# Patient Record
Sex: Female | Born: 1970 | Race: White | Hispanic: No | Smoking: Former smoker
Health system: Southern US, Community
[De-identification: ages and names within clinical notes are randomized; demographics above are authoritative.]

## PROBLEM LIST (undated history)

## (undated) DIAGNOSIS — F419 Anxiety disorder, unspecified: Secondary | ICD-10-CM

## (undated) DIAGNOSIS — J45909 Unspecified asthma, uncomplicated: Secondary | ICD-10-CM

## (undated) DIAGNOSIS — L709 Acne, unspecified: Secondary | ICD-10-CM

## (undated) DIAGNOSIS — M797 Fibromyalgia: Secondary | ICD-10-CM

## (undated) DIAGNOSIS — K219 Gastro-esophageal reflux disease without esophagitis: Secondary | ICD-10-CM

## (undated) DIAGNOSIS — T7840XA Allergy, unspecified, initial encounter: Secondary | ICD-10-CM

## (undated) DIAGNOSIS — H6990 Unspecified Eustachian tube disorder, unspecified ear: Secondary | ICD-10-CM

## (undated) DIAGNOSIS — E119 Type 2 diabetes mellitus without complications: Secondary | ICD-10-CM

## (undated) HISTORY — PX: MYRINGOTOMY WITH TUBE PLACEMENT: SHX5663

## (undated) HISTORY — PX: TUBAL LIGATION: SHX77

---

## 1998-02-18 ENCOUNTER — Ambulatory Visit (HOSPITAL_COMMUNITY): Admission: RE | Admit: 1998-02-18 | Discharge: 1998-02-18 | Payer: Self-pay | Admitting: *Deleted

## 1998-04-30 ENCOUNTER — Ambulatory Visit (HOSPITAL_COMMUNITY): Admission: RE | Admit: 1998-04-30 | Discharge: 1998-04-30 | Payer: Self-pay | Admitting: *Deleted

## 1998-05-08 ENCOUNTER — Emergency Department (HOSPITAL_COMMUNITY): Admission: EM | Admit: 1998-05-08 | Discharge: 1998-05-08 | Payer: Self-pay | Admitting: *Deleted

## 1998-07-30 ENCOUNTER — Encounter: Admission: RE | Admit: 1998-07-30 | Discharge: 1998-10-28 | Payer: Self-pay | Admitting: *Deleted

## 1998-09-06 ENCOUNTER — Inpatient Hospital Stay (HOSPITAL_COMMUNITY): Admission: AD | Admit: 1998-09-06 | Discharge: 1998-09-06 | Payer: Self-pay | Admitting: Obstetrics

## 1998-09-26 ENCOUNTER — Inpatient Hospital Stay (HOSPITAL_COMMUNITY): Admission: AD | Admit: 1998-09-26 | Discharge: 1998-09-26 | Payer: Self-pay | Admitting: *Deleted

## 1998-09-28 ENCOUNTER — Inpatient Hospital Stay (HOSPITAL_COMMUNITY): Admission: AD | Admit: 1998-09-28 | Discharge: 1998-10-01 | Payer: Self-pay | Admitting: Obstetrics

## 1998-10-09 ENCOUNTER — Inpatient Hospital Stay (HOSPITAL_COMMUNITY): Admission: AD | Admit: 1998-10-09 | Discharge: 1998-10-12 | Payer: Self-pay | Admitting: *Deleted

## 1998-10-13 ENCOUNTER — Inpatient Hospital Stay (HOSPITAL_COMMUNITY): Admission: AD | Admit: 1998-10-13 | Discharge: 1998-10-15 | Payer: Self-pay | Admitting: *Deleted

## 1999-07-09 ENCOUNTER — Emergency Department (HOSPITAL_COMMUNITY): Admission: EM | Admit: 1999-07-09 | Discharge: 1999-07-09 | Payer: Self-pay | Admitting: Internal Medicine

## 1999-07-22 ENCOUNTER — Encounter: Payer: Self-pay | Admitting: Emergency Medicine

## 1999-07-22 ENCOUNTER — Emergency Department (HOSPITAL_COMMUNITY): Admission: EM | Admit: 1999-07-22 | Discharge: 1999-07-22 | Payer: Self-pay | Admitting: Emergency Medicine

## 2000-04-07 ENCOUNTER — Emergency Department (HOSPITAL_COMMUNITY): Admission: EM | Admit: 2000-04-07 | Discharge: 2000-04-07 | Payer: Self-pay | Admitting: Emergency Medicine

## 2000-04-07 ENCOUNTER — Encounter: Payer: Self-pay | Admitting: Emergency Medicine

## 2000-05-22 ENCOUNTER — Emergency Department (HOSPITAL_COMMUNITY): Admission: EM | Admit: 2000-05-22 | Discharge: 2000-05-22 | Payer: Self-pay | Admitting: Emergency Medicine

## 2001-08-23 ENCOUNTER — Emergency Department (HOSPITAL_COMMUNITY): Admission: EM | Admit: 2001-08-23 | Discharge: 2001-08-23 | Payer: Self-pay | Admitting: Emergency Medicine

## 2001-12-30 ENCOUNTER — Emergency Department (HOSPITAL_COMMUNITY): Admission: EM | Admit: 2001-12-30 | Discharge: 2001-12-31 | Payer: Self-pay | Admitting: Emergency Medicine

## 2002-01-01 ENCOUNTER — Emergency Department (HOSPITAL_COMMUNITY): Admission: RE | Admit: 2002-01-01 | Discharge: 2002-01-01 | Payer: Self-pay | Admitting: *Deleted

## 2002-01-01 ENCOUNTER — Encounter: Payer: Self-pay | Admitting: Emergency Medicine

## 2002-01-05 ENCOUNTER — Emergency Department (HOSPITAL_COMMUNITY): Admission: EM | Admit: 2002-01-05 | Discharge: 2002-01-05 | Payer: Self-pay | Admitting: Emergency Medicine

## 2002-01-27 ENCOUNTER — Emergency Department (HOSPITAL_COMMUNITY): Admission: EM | Admit: 2002-01-27 | Discharge: 2002-01-27 | Payer: Self-pay | Admitting: Emergency Medicine

## 2002-01-27 ENCOUNTER — Encounter: Payer: Self-pay | Admitting: Emergency Medicine

## 2003-08-06 ENCOUNTER — Other Ambulatory Visit: Admission: RE | Admit: 2003-08-06 | Discharge: 2003-08-06 | Payer: Self-pay | Admitting: Family Medicine

## 2004-04-06 ENCOUNTER — Emergency Department (HOSPITAL_COMMUNITY): Admission: EM | Admit: 2004-04-06 | Discharge: 2004-04-06 | Payer: Self-pay | Admitting: Emergency Medicine

## 2004-08-06 ENCOUNTER — Emergency Department (HOSPITAL_COMMUNITY): Admission: EM | Admit: 2004-08-06 | Discharge: 2004-08-06 | Payer: Self-pay | Admitting: Emergency Medicine

## 2004-08-17 ENCOUNTER — Emergency Department (HOSPITAL_COMMUNITY): Admission: EM | Admit: 2004-08-17 | Discharge: 2004-08-17 | Payer: Self-pay | Admitting: Emergency Medicine

## 2004-08-22 ENCOUNTER — Emergency Department (HOSPITAL_COMMUNITY): Admission: EM | Admit: 2004-08-22 | Discharge: 2004-08-22 | Payer: Self-pay | Admitting: Emergency Medicine

## 2004-12-20 ENCOUNTER — Emergency Department (HOSPITAL_COMMUNITY): Admission: AD | Admit: 2004-12-20 | Discharge: 2004-12-20 | Payer: Self-pay | Admitting: Family Medicine

## 2005-01-27 ENCOUNTER — Emergency Department (HOSPITAL_COMMUNITY): Admission: EM | Admit: 2005-01-27 | Discharge: 2005-01-27 | Payer: Self-pay | Admitting: Family Medicine

## 2005-01-29 ENCOUNTER — Emergency Department (HOSPITAL_COMMUNITY): Admission: EM | Admit: 2005-01-29 | Discharge: 2005-01-29 | Payer: Self-pay | Admitting: Emergency Medicine

## 2005-01-30 ENCOUNTER — Emergency Department (HOSPITAL_COMMUNITY): Admission: EM | Admit: 2005-01-30 | Discharge: 2005-01-30 | Payer: Self-pay | Admitting: Emergency Medicine

## 2005-02-02 ENCOUNTER — Emergency Department (HOSPITAL_COMMUNITY): Admission: EM | Admit: 2005-02-02 | Discharge: 2005-02-02 | Payer: Self-pay | Admitting: Emergency Medicine

## 2005-05-26 ENCOUNTER — Emergency Department (HOSPITAL_COMMUNITY): Admission: EM | Admit: 2005-05-26 | Discharge: 2005-05-26 | Payer: Self-pay | Admitting: Emergency Medicine

## 2005-09-24 ENCOUNTER — Emergency Department (HOSPITAL_COMMUNITY): Admission: AD | Admit: 2005-09-24 | Discharge: 2005-09-24 | Payer: Self-pay | Admitting: Family Medicine

## 2005-09-25 ENCOUNTER — Emergency Department (HOSPITAL_COMMUNITY): Admission: EM | Admit: 2005-09-25 | Discharge: 2005-09-25 | Payer: Self-pay | Admitting: Family Medicine

## 2005-09-26 ENCOUNTER — Emergency Department (HOSPITAL_COMMUNITY): Admission: EM | Admit: 2005-09-26 | Discharge: 2005-09-26 | Payer: Self-pay | Admitting: Emergency Medicine

## 2005-11-03 ENCOUNTER — Emergency Department (HOSPITAL_COMMUNITY): Admission: EM | Admit: 2005-11-03 | Discharge: 2005-11-03 | Payer: Self-pay | Admitting: Family Medicine

## 2006-03-03 ENCOUNTER — Encounter: Admission: RE | Admit: 2006-03-03 | Discharge: 2006-03-03 | Payer: Self-pay | Admitting: Family Medicine

## 2006-04-01 ENCOUNTER — Emergency Department (HOSPITAL_COMMUNITY): Admission: EM | Admit: 2006-04-01 | Discharge: 2006-04-01 | Payer: Self-pay | Admitting: Family Medicine

## 2006-06-21 ENCOUNTER — Emergency Department (HOSPITAL_COMMUNITY): Admission: EM | Admit: 2006-06-21 | Discharge: 2006-06-21 | Payer: Self-pay | Admitting: Emergency Medicine

## 2006-09-17 ENCOUNTER — Emergency Department (HOSPITAL_COMMUNITY): Admission: EM | Admit: 2006-09-17 | Discharge: 2006-09-17 | Payer: Self-pay | Admitting: Family Medicine

## 2006-09-27 ENCOUNTER — Emergency Department (HOSPITAL_COMMUNITY): Admission: EM | Admit: 2006-09-27 | Discharge: 2006-09-27 | Payer: Self-pay | Admitting: Family Medicine

## 2006-11-07 ENCOUNTER — Other Ambulatory Visit: Admission: RE | Admit: 2006-11-07 | Discharge: 2006-11-07 | Payer: Self-pay | Admitting: Family Medicine

## 2006-11-26 ENCOUNTER — Emergency Department (HOSPITAL_COMMUNITY): Admission: EM | Admit: 2006-11-26 | Discharge: 2006-11-26 | Payer: Self-pay | Admitting: Family Medicine

## 2007-08-28 ENCOUNTER — Emergency Department (HOSPITAL_COMMUNITY): Admission: EM | Admit: 2007-08-28 | Discharge: 2007-08-28 | Payer: Self-pay | Admitting: Family Medicine

## 2008-01-21 ENCOUNTER — Emergency Department (HOSPITAL_COMMUNITY): Admission: EM | Admit: 2008-01-21 | Discharge: 2008-01-21 | Payer: Self-pay | Admitting: Family Medicine

## 2008-08-25 ENCOUNTER — Emergency Department (HOSPITAL_COMMUNITY): Admission: EM | Admit: 2008-08-25 | Discharge: 2008-08-25 | Payer: Self-pay | Admitting: Emergency Medicine

## 2008-11-28 ENCOUNTER — Encounter: Admission: RE | Admit: 2008-11-28 | Discharge: 2008-11-28 | Payer: Self-pay | Admitting: Family Medicine

## 2010-01-29 ENCOUNTER — Other Ambulatory Visit: Admission: RE | Admit: 2010-01-29 | Discharge: 2010-01-29 | Payer: Self-pay | Admitting: Family Medicine

## 2011-02-02 ENCOUNTER — Other Ambulatory Visit: Payer: Self-pay | Admitting: Family Medicine

## 2011-02-02 ENCOUNTER — Other Ambulatory Visit (HOSPITAL_COMMUNITY)
Admission: RE | Admit: 2011-02-02 | Discharge: 2011-02-02 | Disposition: A | Discharge status: Home / Self Care | Payer: Medicaid Other | Source: Ambulatory Visit | Attending: Family Medicine | Admitting: Family Medicine

## 2011-02-02 DIAGNOSIS — Z124 Encounter for screening for malignant neoplasm of cervix: Secondary | ICD-10-CM | POA: Insufficient documentation

## 2011-04-15 LAB — POCT PREGNANCY, URINE: Preg Test, Ur: NEGATIVE

## 2011-08-26 ENCOUNTER — Inpatient Hospital Stay (EMERGENCY_DEPARTMENT_HOSPITAL)
Admission: AD | Admit: 2011-08-26 | Discharge: 2011-08-26 | Disposition: A | Discharge status: Home / Self Care | Payer: Medicaid Other | Source: Ambulatory Visit | Attending: Obstetrics & Gynecology | Admitting: Obstetrics & Gynecology

## 2011-08-26 ENCOUNTER — Emergency Department (HOSPITAL_COMMUNITY): Payer: Medicaid Other

## 2011-08-26 ENCOUNTER — Encounter (HOSPITAL_COMMUNITY): Payer: Self-pay | Admitting: *Deleted

## 2011-08-26 ENCOUNTER — Emergency Department (HOSPITAL_COMMUNITY)
Admission: EM | Admit: 2011-08-26 | Discharge: 2011-08-26 | Disposition: A | Discharge status: Home / Self Care | Payer: Medicaid Other | Attending: Emergency Medicine | Admitting: Emergency Medicine

## 2011-08-26 ENCOUNTER — Encounter (HOSPITAL_COMMUNITY): Payer: Self-pay

## 2011-08-26 DIAGNOSIS — F411 Generalized anxiety disorder: Secondary | ICD-10-CM | POA: Insufficient documentation

## 2011-08-26 DIAGNOSIS — N201 Calculus of ureter: Secondary | ICD-10-CM

## 2011-08-26 DIAGNOSIS — R112 Nausea with vomiting, unspecified: Secondary | ICD-10-CM | POA: Insufficient documentation

## 2011-08-26 DIAGNOSIS — R109 Unspecified abdominal pain: Secondary | ICD-10-CM | POA: Insufficient documentation

## 2011-08-26 DIAGNOSIS — N200 Calculus of kidney: Secondary | ICD-10-CM | POA: Insufficient documentation

## 2011-08-26 HISTORY — DX: Acne, unspecified: L70.9

## 2011-08-26 HISTORY — DX: Anxiety disorder, unspecified: F41.9

## 2011-08-26 LAB — COMPREHENSIVE METABOLIC PANEL
ALT: 10 U/L (ref 0–35)
AST: 10 U/L (ref 0–37)
Albumin: 3.9 g/dL (ref 3.5–5.2)
Alkaline Phosphatase: 58 U/L (ref 39–117)
Chloride: 102 mEq/L (ref 96–112)
Creatinine, Ser: 0.67 mg/dL (ref 0.50–1.10)
Potassium: 3.5 mEq/L (ref 3.5–5.1)
Sodium: 136 mEq/L (ref 135–145)
Total Bilirubin: 0.2 mg/dL — ABNORMAL LOW (ref 0.3–1.2)

## 2011-08-26 LAB — URINALYSIS, ROUTINE W REFLEX MICROSCOPIC
Bilirubin Urine: NEGATIVE
Glucose, UA: NEGATIVE mg/dL
Protein, ur: NEGATIVE mg/dL
Specific Gravity, Urine: >1.03 — ABNORMAL HIGH (ref 1.005–1.030)

## 2011-08-26 LAB — URINE MICROSCOPIC-ADD ON

## 2011-08-26 LAB — DIFFERENTIAL
Basophils Absolute: 0 10*3/uL (ref 0.0–0.1)
Basophils Relative: 0 % (ref 0–1)
Lymphocytes Relative: 30 % (ref 12–46)
Neutro Abs: 5.8 10*3/uL (ref 1.7–7.7)
Neutrophils Relative %: 65 % (ref 43–77)

## 2011-08-26 LAB — CBC
MCHC: 32.6 g/dL (ref 30.0–36.0)
Platelets: 275 10*3/uL (ref 150–400)
RDW: 14.3 % (ref 11.5–15.5)
WBC: 9 10*3/uL (ref 4.0–10.5)

## 2011-08-26 MED ORDER — TAMSULOSIN HCL 0.4 MG PO CAPS: 0.4000 mg | ORAL_CAPSULE | Freq: Every day | ORAL | Status: DC

## 2011-08-26 MED ORDER — DICLOFENAC SODIUM 75 MG PO TBEC: 75.0000 mg | DELAYED_RELEASE_TABLET | Freq: Two times a day (BID) | ORAL | Status: DC | PRN

## 2011-08-26 MED ORDER — OXYCODONE-ACETAMINOPHEN 5-325 MG PO TABS: 1.0000 | ORAL_TABLET | Freq: Four times a day (QID) | ORAL | Status: AC | PRN

## 2011-08-26 MED ORDER — MORPHINE SULFATE 4 MG/ML IJ SOLN
6.0000 mg | Freq: Once | INTRAMUSCULAR | Status: AC
  Administered 2011-08-26: 6 mg via INTRAVENOUS
  Filled 2011-08-26 (×2): qty 1

## 2011-08-26 MED ORDER — PROMETHAZINE HCL 25 MG PO TABS: 25.0000 mg | ORAL_TABLET | Freq: Four times a day (QID) | ORAL | Status: AC | PRN

## 2011-08-26 MED ORDER — SODIUM CHLORIDE 0.9 % IV BOLUS (SEPSIS)
1000.0000 mL | Freq: Once | INTRAVENOUS | Status: AC
  Administered 2011-08-26: 1000 mL via INTRAVENOUS

## 2011-08-26 MED ORDER — KETOROLAC TROMETHAMINE 30 MG/ML IJ SOLN
30.0000 mg | Freq: Once | INTRAMUSCULAR | Status: AC
  Administered 2011-08-26: 30 mg via INTRAVENOUS
  Filled 2011-08-26: qty 1

## 2011-08-26 NOTE — ED Provider Notes (Signed)
41 year old female has been having left flank pain intermittently for the last 3 days. Today she developed vomiting. No fever or chills. CT scan shows a 6 mm calculus in the left ureteropelvic junction. Intermittent pain is probably related to ball-valve phenomenon. Because of the size of the stone and its location, she is likely to need urologic intervention. This is explained to the patient. Currently, she is pain-free and feeling well.  Dione Booze, MD 08/26/11 2157

## 2011-08-26 NOTE — ED Notes (Signed)
Pt c/o L flank and abdominal pain x 3 days. Pt states n/v w/ pain. Pt is presently pain free. Seen at [facility] today for evaluation of the pain.

## 2011-08-26 NOTE — ED Provider Notes (Signed)
History     CSN: [id number]  Arrival date & time 08/26/11  0865   First MD Initiated Contact with Patient 08/26/11 2019      Chief Complaint  Patient presents with  . Flank Pain    r/o kidney stones  . Abdominal Pain  . Nausea  . Emesis    (Consider location/radiation/quality/duration/timing/severity/associated sxs/prior treatment) HPI Presents the emergency department with a three-day history of left flank pain.  She has had some nausea and vomiting with the pain.  She states that she has intermittent type pain it has not been consistent during this time frame.  She states she was seen at [facility] for the pain the tissues were there may be some sort of female issue.  He states that they did not find any problems on their examination there.  She was sent here for further evaluation and testing.  She denies chest pain, shortness of breath, weakness, diarrhea, headache, visual changes, or fever. Past Medical History  Diagnosis Date  . Anxiety   . Acne     on bactrim    Past Surgical History  Procedure Date  . Tubal ligation     Family History  Problem Relation Age of Onset  . Anesthesia problems Neg Hx     History  Substance Use Topics  . Smoking status: Never Smoker   . Smokeless tobacco: Not on file  . Alcohol Use:     OB History    Grav Para Term Preterm Abortions TAB SAB Ect Mult Living   4 3 3  0 1 1 0 0 0 3      Review of Systems All pertinent positives and negatives reviewed in the history of present illness  Allergies  Review of patient's allergies indicates no known allergies.  Home Medications   Current Outpatient Rx  Name Route Sig Dispense Refill  . ALPRAZOLAM 0.5 MG PO TABS Oral Take 0.5 mg by mouth daily as needed. Takes for anxiety    . DICLOFENAC SODIUM 75 MG PO TBEC Oral Take 1 tablet (75 mg total) by mouth 2 (two) times daily as needed (pain). 30 tablet 0  . SULFAMETHOXAZOLE-TRIMETHOPRIM 800-160 MG PO TABS Oral Take 1 tablet by  mouth once.      BP 136/92  Pulse 65  Temp(Src) 98.7 F (37.1 C) (Oral)  Resp 18  SpO2 97%  LMP 08/14/2011  Physical Exam  Constitutional: She is oriented to person, place, and time. She appears well-developed and well-nourished. No distress.  HENT:  Head: Normocephalic and atraumatic.  Eyes: Pupils are equal, round, and reactive to light.  Neck: Normal range of motion. Neck supple.  Cardiovascular: Normal rate, regular rhythm and normal heart sounds.  Exam reveals no gallop and no friction rub.   No murmur heard. Pulmonary/Chest: Effort normal and breath sounds normal. No respiratory distress. She has no wheezes. She has no rales.  Abdominal: Soft. Bowel sounds are normal. She exhibits no distension. There is no tenderness. There is no rebound and no guarding.  Neurological: She is alert and oriented to person, place, and time.  Skin: Skin is warm and dry. No rash noted.    ED Course  Procedures (including critical care time)   Ct Abdomen Pelvis Wo Contrast  08/26/2011  *RADIOLOGY REPORT*  Clinical Data: Left-sided pain  CT ABDOMEN AND PELVIS WITHOUT CONTRAST  Technique:  Multidetector CT imaging of the abdomen and pelvis was performed following the standard protocol without intravenous contrast.  Comparison: None.  Findings:  Limited images through the lung bases demonstrate no significant appreciable abnormality. The heart size is within normal limits. No pleural or pericardial effusion.  Intra-abdominal organ evaluation is limited without intravenous contrast.  Within this limitation, unremarkable liver, biliary system, spleen, pancreas, adrenal glands.  Bilateral lobular renal contours without perinephric fat stranding. There is mild left pelvicaliectasis and hydroureter to the level of a 6 mm stone within the proximal left ureter.  Minimal left periureteral fat stranding.  No bowel obstruction.  No CT evidence for colitis.  Normal appendix.  No free intraperitoneal air or fluid.  No  lymphadenopathy.  Vasculature normal in caliber.  Thin-walled bladder.  Unremarkable CT appearance to the uterus and adnexa.  No acute osseous abnormality.  IMPRESSION: Mild left hydronephrosis and hydroureter to the level of a 6 mm proximal ureteral stone.  Original Report Authenticated By: Waneta Martins, M.D.     Patient is in stable here in the emergency department.  She denies any pain at this time.  All questions were answered for the patient.  She is educated on when to return to the emergency department.  She is told to call the urologist on Monday morning to set up a followup appointment.  Advised to increase her fluid intake.  She has been rechecked x3  here in the department by me.    MDM  MDM Reviewed: nursing note, previous chart and vitals Reviewed previous: labs Interpretation: CT scan            Carlyle Dolly, PA-C 08/26/11 2315

## 2011-08-26 NOTE — ED Notes (Signed)
Patient transported to CT 

## 2011-08-26 NOTE — Progress Notes (Signed)
3 days ago had pain on left side, made her throw up, yesterday pain was more in the back.  Ate today, then thrown up, pain in LLQ came again.

## 2011-08-26 NOTE — ED Provider Notes (Signed)
History     Chief Complaint  Patient presents with  . Abdominal Pain   HPI Kathryn Moon is 41 y.o. [phone] presenting with sharp non-radiating, non-mobile left lower abdomen that began 4 days ago.  Thought she had a virus but sxs subsided and this pain remains. Pain comes with eating but not always.  Has pain in her lower back today.  LMP 1/21.  Patient of Dr. Lupe Carney.  Denies gyn hx or kidney stones.  Has not eaten because she is afraid to eat that pain will recur.  Denies fever.  Nausea and vomiting only with pain.  Denies vaginal bleeding or discharge.  Denies pelvic pain.  Currently pain is 2-3 out of 10.   Past Medical History  Diagnosis Date  . Anxiety   . Acne     on bactrim    Past Surgical History  Procedure Date  . Tubal ligation     Family History  Problem Relation Age of Onset  . Anesthesia problems Neg Hx     History  Substance Use Topics  . Smoking status: Never Smoker   . Smokeless tobacco: Not on file  . Alcohol Use:     Allergies: No Known Allergies  Prescriptions prior to admission  Medication Sig Dispense Refill  . ALPRAZolam (XANAX) 0.5 MG tablet Take 0.5 mg by mouth daily as needed. Takes for anxiety      . ibuprofen (ADVIL,MOTRIN) 200 MG tablet Take 400 mg by mouth every 6 (six) hours as needed.      . sulfamethoxazole-trimethoprim (BACTRIM DS,SEPTRA DS) 800-160 MG per tablet Take 1 tablet by mouth once.        Review of Systems  Constitutional: Negative for fever and chills.  Gastrointestinal: Positive for nausea, vomiting and abdominal pain (lower left side).  Genitourinary: Negative.        Neg for vaginal bleeding and discharge   Physical Exam   Blood pressure 130/72, pulse 74, temperature 99.5 F (37.5 C), temperature source Oral, resp. rate 20, height 5\' 1"  (1.549 m), weight 200 lb (90.719 kg), last menstrual period 08/14/2011, SpO2 97.00%.  Physical Exam  Constitutional: She is oriented to person, place, and time. She  appears well-developed and well-nourished. No distress.  HENT:  Head: Normocephalic.  Neck: Normal range of motion.  Respiratory: Effort normal.  GI: Soft. Bowel sounds are normal. She exhibits no distension and no mass. There is tenderness. There is no rebound and no guarding.  Genitourinary: There is no rash, tenderness, lesion or injury on the right labia. There is no rash, tenderness, lesion or injury on the left labia. Uterus is not deviated, not enlarged, not fixed and not tender. Cervix exhibits no motion tenderness, no discharge and no friability. Right adnexum displays no mass, no tenderness and no fullness. Left adnexum displays no mass, no tenderness and no fullness. No erythema, tenderness or bleeding around the vagina. No foreign body around the vagina. No signs of injury around the vagina. No vaginal discharge found.  Neurological: She is alert and oriented to person, place, and time. No cranial nerve deficit. Coordination normal.  Skin: Skin is warm and dry. No rash noted. No erythema. No pallor.  Psychiatric: She has a normal mood and affect. Her behavior is normal. Judgment and thought content normal.   Lab Results  Component Value Date   WBC 9.0 08/26/2011   HGB 11.4* 08/26/2011   HCT 35.0* 08/26/2011   MCV 80.8 08/26/2011   PLT 275 08/26/2011  Lab Results  Component Value Date   CREATININE 0.67 08/26/2011   BUN 8 08/26/2011   NA 136 08/26/2011   K 3.5 08/26/2011   CL 102 08/26/2011   CO2 25 08/26/2011   Urinalysis    Component Value Date/Time   COLORURINE YELLOW 08/26/2011 1700   APPEARANCEUR CLOUDY* 08/26/2011 1700   LABSPEC >1.030* 08/26/2011 1700   PHURINE 5.5 08/26/2011 1700   GLUCOSEU NEGATIVE 08/26/2011 1700   HGBUR LARGE* 08/26/2011 1700   BILIRUBINUR NEGATIVE 08/26/2011 1700   KETONESUR NEGATIVE 08/26/2011 1700   PROTEINUR NEGATIVE 08/26/2011 1700   UROBILINOGEN 0.2 08/26/2011 1700   NITRITE NEGATIVE 08/26/2011 1700   LEUKOCYTESUR NEGATIVE 08/26/2011 1700    MAU Course   Procedures  MDM Pelvic exam negative for signs or symptoms of PID, vaginitis, ovarian torsion.  Urine pregnancy test negative, ruling out ectopic pregnancy.  With Large blood in urine, suggestive of possible kidney stone, though history relates an episodic fixed spot of pain.    Assessment and Plan  1.  Abdominal pain - Diclofenac for abdominal pain - Discussed possibility of transfer to [facility] or [facility] for further work up vs waiting through the weekend to see PCP.  Patient opted to wait to see PCP as pain is minimal now.  Patient counseled to return if symptoms worsen.  Candelaria Celeste JEHIEL 08/26/2011 7:03 PM

## 2011-08-26 NOTE — Progress Notes (Signed)
Pt states has had several episodes of "stabbing" lower left quadrant pain over past 3 days. States yesterday had pain in the middle of her back. Has had 2 episodes of vomiting that she believe is due to the pain. States doesn't have pain at this moment.

## 2011-08-27 NOTE — ED Provider Notes (Signed)
Medical screening examination/treatment/procedure(s) were conducted as a shared visit with non-physician practitioner(s) and myself.  I personally evaluated the patient during the encounter   Derrick Tiegs, MD 08/27/11 0131 

## 2011-08-28 LAB — URINE CULTURE
Colony Count: NO GROWTH
Culture  Setup Time: 201302020148
Culture: NO GROWTH

## 2011-08-29 LAB — POCT PREGNANCY, URINE: Preg Test, Ur: NEGATIVE

## 2011-09-06 ENCOUNTER — Other Ambulatory Visit: Payer: Self-pay | Admitting: Urology

## 2011-09-13 ENCOUNTER — Other Ambulatory Visit (HOSPITAL_COMMUNITY): Payer: Self-pay | Admitting: *Deleted

## 2011-09-14 ENCOUNTER — Encounter (HOSPITAL_COMMUNITY): Payer: Self-pay | Admitting: *Deleted

## 2011-09-14 ENCOUNTER — Encounter (HOSPITAL_COMMUNITY): Payer: Self-pay | Admitting: Pharmacy Technician

## 2011-09-15 ENCOUNTER — Encounter (HOSPITAL_COMMUNITY): Payer: Self-pay | Admitting: *Deleted

## 2011-09-15 ENCOUNTER — Encounter (HOSPITAL_COMMUNITY)
Admission: RE | Disposition: A | Discharge status: Home / Self Care | Payer: Self-pay | Source: Ambulatory Visit | Attending: Urology

## 2011-09-15 ENCOUNTER — Ambulatory Visit (HOSPITAL_COMMUNITY): Payer: Medicaid Other

## 2011-09-15 ENCOUNTER — Ambulatory Visit (HOSPITAL_COMMUNITY)
Admission: RE | Admit: 2011-09-15 | Discharge: 2011-09-15 | Disposition: A | Discharge status: Home / Self Care | Payer: Medicaid Other | Source: Ambulatory Visit | Attending: Urology | Admitting: Urology

## 2011-09-15 DIAGNOSIS — R112 Nausea with vomiting, unspecified: Secondary | ICD-10-CM | POA: Insufficient documentation

## 2011-09-15 DIAGNOSIS — Z79899 Other long term (current) drug therapy: Secondary | ICD-10-CM | POA: Insufficient documentation

## 2011-09-15 DIAGNOSIS — R109 Unspecified abdominal pain: Secondary | ICD-10-CM | POA: Insufficient documentation

## 2011-09-15 DIAGNOSIS — F411 Generalized anxiety disorder: Secondary | ICD-10-CM | POA: Insufficient documentation

## 2011-09-15 DIAGNOSIS — N200 Calculus of kidney: Secondary | ICD-10-CM | POA: Insufficient documentation

## 2011-09-15 DIAGNOSIS — F329 Major depressive disorder, single episode, unspecified: Secondary | ICD-10-CM | POA: Insufficient documentation

## 2011-09-15 DIAGNOSIS — F3289 Other specified depressive episodes: Secondary | ICD-10-CM | POA: Insufficient documentation

## 2011-09-15 SURGERY — LITHOTRIPSY, ESWL
Anesthesia: LOCAL | Laterality: Left

## 2011-09-15 MED ORDER — CIPROFLOXACIN HCL 500 MG PO TABS: 500.0000 mg | ORAL_TABLET | Freq: Two times a day (BID) | ORAL | Status: AC

## 2011-09-15 MED ORDER — DIPHENHYDRAMINE HCL 25 MG PO CAPS
25.0000 mg | ORAL_CAPSULE | ORAL | Status: AC
  Administered 2011-09-15: 25 mg via ORAL

## 2011-09-15 MED ORDER — TAMSULOSIN HCL 0.4 MG PO CAPS: 0.4000 mg | ORAL_CAPSULE | Freq: Every day | ORAL | Status: DC

## 2011-09-15 MED ORDER — CIPROFLOXACIN HCL 500 MG PO TABS
ORAL_TABLET | ORAL | Status: AC
  Filled 2011-09-15: qty 1

## 2011-09-15 MED ORDER — DIPHENHYDRAMINE HCL 25 MG PO CAPS
ORAL_CAPSULE | ORAL | Status: AC
  Filled 2011-09-15: qty 1

## 2011-09-15 MED ORDER — MORPHINE SULFATE 10 MG/ML IJ SOLN: 2.0000 mg | INTRAMUSCULAR | Status: DC | PRN

## 2011-09-15 MED ORDER — DEXTROSE-NACL 5-0.45 % IV SOLN
INTRAVENOUS | Status: DC
  Administered 2011-09-15: 14:00:00 via INTRAVENOUS

## 2011-09-15 MED ORDER — CIPROFLOXACIN HCL 500 MG PO TABS
500.0000 mg | ORAL_TABLET | ORAL | Status: AC
  Administered 2011-09-15: 500 mg via ORAL

## 2011-09-15 MED ORDER — DIAZEPAM 5 MG PO TABS
10.0000 mg | ORAL_TABLET | ORAL | Status: AC
  Administered 2011-09-15: 10 mg via ORAL

## 2011-09-15 MED ORDER — DIAZEPAM 5 MG PO TABS
ORAL_TABLET | ORAL | Status: AC
  Filled 2011-09-15: qty 2

## 2011-09-15 MED ORDER — OXYCODONE-ACETAMINOPHEN 5-325 MG PO TABS: 1.0000 | ORAL_TABLET | ORAL | Status: AC | PRN

## 2011-09-15 NOTE — H&P (Addendum)
History of Present Illness        The patient referred for nephrolithiasis. Patient is a 41 year old female who developed left flank pain approximately 10 days ago. The pain is intermittent and quite severe at times. She has had some nausea and vomiting with this pain. CT scan of the abdomen and pelvis was obtained August 26, 2011-I reviewed on the images-which showed a left 6 mm proximal ureteral stone with mild hydronephrosis. There were no other renal or ureteral stones. The stone was visible on the scout image. She is well today without pain, fever, nausea or emesis.   No prior history of stones.  She has no bothersome frequency or urgency. Nocturia x 0-1. No gross hemauturia.   She has history of a bilateral tubal ligation.   Past Medical History Problems  1. History of  Anxiety (Symptom) 300.00 2. History of  Depression 311  Surgical History Problems  1. History of  Tubal Ligation V25.2  Current Meds 1. Diclofenac Sodium 75 MG Oral Tablet Delayed Release; Therapy: (Recorded:12Feb2013) to 2. Percocet 5-325 MG Oral Tablet; Therapy: (Recorded:12Feb2013) to 3. PROzac 20 MG Oral Capsule; Therapy: (Recorded:12Feb2013) to 4. Tamsulosin HCl 0.4 MG Oral Capsule; Therapy: (Recorded:12Feb2013) to 5. Xanax TABS; Therapy: (Recorded:12Feb2013) to  Allergies Medication  1. No Known Drug Allergies  Family History Problems  1. Family history of  Family Health Status Number Of Children 2 sons  1 daughter 2. 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  Gastrointestinal: nausea, vomiting, abdominal pain, diarrhea and constipation.  Musculoskeletal: back pain.  Psychiatric: depression and anxiety.    Vitals Vital Signs [Data Includes: Last 1 Day]  12Feb2013 01:10PM  BMI Calculated: 37.38 BSA Calculated: 1.88 Height: 5 ft 1 in Weight: 198 lb  Blood Pressure: 124 / 77 Temperature: 97.7 F Heart Rate: 73  Physical Exam Constitutional: Well nourished and well developed . No acute distress.  ENT:. The ears and nose are normal in appearance.  Neck: The appearance of the neck is normal and no neck mass is present.  Pulmonary: No respiratory distress and normal respiratory rhythm and effort.  Cardiovascular: Heart rate and rhythm are normal . No peripheral edema.  Skin: Normal skin turgor, no visible rash and no visible skin lesions.  Neuro/Psych:. Mood and affect are appropriate.  Abdomen: No CVA tenderness.    Results/Data Urine [Data Includes: Last 1 Day]   12Feb2013  COLOR YELLOW   APPEARANCE CLOUDY   SPECIFIC GRAVITY 1.020   pH 6.5   GLUCOSE NEG mg/dL  BILIRUBIN NEG   KETONE NEG mg/dL  BLOOD LARGE   PROTEIN NEG mg/dL  UROBILINOGEN 0.2 mg/dL  NITRITE NEG   LEUKOCYTE ESTERASE NEG   SQUAMOUS EPITHELIAL/HPF FEW   WBC 0-3 WBC/hpf  RBC TNTC RBC/hpf  BACTERIA FEW   CRYSTALS NONE SEEN   CASTS NONE SEEN    Old records or history reviewed: 11 pages.    Procedure  KUB today-comparison CT-findings: The stone has migrated and is now in the left lower pole. It is 1 cm. This correlates to the sagittal images on the CT.  Assessment Assessed  1. Nephrolithiasis Of The Left Kidney 592.0  Plan Health Maintenance (V70.0)  1. UA With REFLEX  Done: 12Feb2013 12:48PM Nephrolithiasis Of The Left Kidney (592.0)  2. Follow-up Schedule Surgery Office  Follow-up  Requested for: 12Feb2013 1  Ureteral Stone (592.1)  3. Hydrocodone-Acetaminophen 5-325 MG Oral Tablet; TAKE 1 TO 2 TABLETS EVERY 4 TO 6  HOURS AS  NEEDED FOR PAIN; Therapy: 12Feb2013 to (Evaluate:14Mar2013); Last  Rx:12Feb2013 4. KUB  Done: 12Feb2013 12:00AM  1. Amended By: Jerilee Field; 09/06/2011 2:08 PMEST   Discussion/Summary  I discussed with patient CT and KUB findings (we actually reviewed the images). The stone appears to be back in the LLP. We discussed the nature, risks and benefits of doing nothing/surveillance, cysto/stent/ureteroscopy/laser lithotripsy (fail to gain retrograde access, need for repeat therapy/multiple procedures, ureteral injury, stent pain among others), ESWL (failure to fragment, failure to pass fragments, obstruction, need for additional procedures/stenting, bleeding requiring transfusion or embolization, infection, damage to kidney/ureter/other structures, pain among others). All questions answered. Pt elects to proceed with left ESWL. She doesnt want to have to pass the stone again. We discussed treatment goals may not be reached and/or may require multiple/repeat procedures. We discussed the likelihood of achieving the goals of the procedure and potential problems that might occur during the procedure or recuperation.  She was instructed to stop ibuprofen and diclofenac. Pain meds refilled.   Pt seen and examined. No change in h&p.

## 2011-09-15 NOTE — Brief Op Note (Signed)
09/15/2011  4:20 PM  PATIENT:  Kathryn Moon  41 y.o. female  PRE-OPERATIVE DIAGNOSIS:  Left Renal Stone  POST-OPERATIVE DIAGNOSIS:left renal stone  PROCEDURE:  Procedure(s) (LRB): EXTRACORPOREAL SHOCK WAVE LITHOTRIPSY (ESWL) (Left)  SURGEON:  Surgeon(s) and Role:    * Antony Haste, MD - Primary   ANESTHESIA:   IV sedation  EBL:   0   PLAN OF CARE: Discharge to home after PACU  PATIENT DISPOSITION:  PACU - hemodynamically stable.   Delay start of Pharmacological VTE agent (>24hrs) due to surgical blood loss or risk of bleeding: not applicable

## 2011-09-15 NOTE — Progress Notes (Signed)
Up to BR with assist and pt voided and stated "it was bloody" but pt flushed toilet before it could be observed

## 2011-09-15 NOTE — Discharge Instructions (Signed)
Lithotripsy for Kidney Stones WHAT ARE KIDNEY STONES? The kidneys filter blood for chemicals the body cannot use. These waste chemicals are eliminated in the urine. They are removed from the body. Under some conditions, these chemicals may become concentrated. When this happens, they form crystals in the urine. When these crystals build up and stick together, stones may form. When these stones block the flow of urine through the urinary tract, they may cause severe pain. The urinary tract is very sensitive to blockage and stretching by the stone. WHAT IS LITHOTRIPSY? Lithotripsy is a treatment that can sometimes help eliminate kidney stones and pain faster. A form of lithotripsy, also known as ESWL (extracorporeal shock wave lithotripsy), is a nonsurgical procedure that helps your body rid itself of the kidney stone with a minimum amount of pain. EWSL is a method of crushing a kidney stone with shock waves. These shock waves pass through your body. They cause the kidney stones to crumble while still in the urinary tract. It is then easier for the smaller pieces of stone to pass in the urine. Lithotripsy usually takes about an hour. It is done in a hospital, a lithotripsy center, or a mobile unit. It usually does not require an overnight stay. Your caregiver will instruct you on preparation for the procedure. Your caregiver will tell you what to expect afterward RISKS AND COMPLICATIONS Complications of lithotripsy are uncommon, but include the following:  Infection.   Bleeding of the kidney.   Bruising of the kidney or skin.   Obstruction of the ureter (the passageway from the kidney to the bladder).   Failure of the stone to fragment (break apart).  PROCEDURE The ureter is the tube that transports the urine from the kidneys to the bladder. This will help keep urine flowing from the kidney if the fragments of the stone block the ureter. You may receive an intravenous (IV) line to give you fluids  and medicines. These medicines may help you relax or make you sleep. During the procedure, you will lie comfortably on a fluid-filled cushion or in a warm-water bath. After an x-ray or ultrasound locates your stone, shock waves are aimed at the stone. If you are awake, you may feel a tapping sensation (feeling) as the shock waves pass through your body. If large stone particles remain after treatment, a second procedure may be necessary at a later date. AFTER THE PROCEDURE  After surgery, you will be taken to the recovery area. A nurse will watch and check your progress. Once you're awake, stable, and taking fluids well, you will be allowed to go home as long as there are no problems. You may be prescribed antibiotics (medicines that kill germs) to help prevent infection. You may also be prescribed pain medicine if needed. In a week or two, your doctor may remove your stent, if you have one. Your caregiver will check to see whether or not stone particles remain. PASSING THE STONE It may take anywhere from a day to several weeks for the stone particles to leave your body. During this time, drink at least 8 to 12 eight ounce glasses of water every day. It is normal for your urine to be cloudy or slightly bloody for a few weeks following this procedure. You may even see small pieces of stone in your urine. A slight fever and some pain are also normal. Your caregiver may ask you to strain your urine to collect some stone particles for chemical analysis. If you find particles  while straining the urine, save them. Analysis tells you and the caregiver what the stone is made of. Knowing this may help prevent future stones. PREVENTING FUTURE STONES  Drink about 8 to 12, eight-ounce glasses of water every day.   Follow the diet your caregiver recommends.   Take your prescribed medicine.   See your caregiver regularly for checkups.  SEEK IMMEDIATE MEDICAL CARE IF:  You develop an oral temperature above 102 F  (38.9 C), or as your caregiver suggests.   Your pain is not relieved by medicine.   You develop nausea (feeling sick to your stomach) and vomiting.   You develop heavy bleeding.   You have difficulty urinating.  Document Released: 07/08/2000 Document Revised: 03/23/2011 Document Reviewed: 05/02/2008 [facility] Patient Information 2012 [place], [place].

## 2011-09-16 ENCOUNTER — Encounter (HOSPITAL_COMMUNITY): Payer: Self-pay

## 2013-03-26 ENCOUNTER — Emergency Department (HOSPITAL_COMMUNITY): Payer: Self-pay

## 2013-03-26 ENCOUNTER — Emergency Department (HOSPITAL_COMMUNITY)
Admission: EM | Admit: 2013-03-26 | Discharge: 2013-03-26 | Disposition: A | Discharge status: Home / Self Care | Payer: Self-pay | Attending: Emergency Medicine | Admitting: Emergency Medicine

## 2013-03-26 ENCOUNTER — Encounter (HOSPITAL_COMMUNITY): Payer: Self-pay | Admitting: Emergency Medicine

## 2013-03-26 DIAGNOSIS — L708 Other acne: Secondary | ICD-10-CM | POA: Insufficient documentation

## 2013-03-26 DIAGNOSIS — R51 Headache: Secondary | ICD-10-CM | POA: Insufficient documentation

## 2013-03-26 DIAGNOSIS — F411 Generalized anxiety disorder: Secondary | ICD-10-CM | POA: Insufficient documentation

## 2013-03-26 DIAGNOSIS — R413 Other amnesia: Secondary | ICD-10-CM | POA: Insufficient documentation

## 2013-03-26 MED ORDER — IBUPROFEN 200 MG PO TABS
600.0000 mg | ORAL_TABLET | Freq: Once | ORAL | Status: AC
  Administered 2013-03-26: 600 mg via ORAL
  Filled 2013-03-26: qty 3

## 2013-03-26 NOTE — ED Provider Notes (Signed)
CSN: [id number]     Arrival date & time 03/26/13  1708 History  This chart was scribed for non-physician practitioner, Earley Favor, FNP working with Juliet Rude. Rubin Payor, MD by Greggory Stallion, ED scribe. This patient was seen in room WTR7/WTR7 and the patient's care was started at 8:11 PM.   Chief Complaint  Patient presents with  . Headache   The history is provided by the patient. No language interpreter was used.    HPI Comments: Kathryn Moon is a 42 y.o. female who presents to the Emergency Department complaining of intermittent headaches for 2 years on her left side. Pt states the headaches last 2-3 days normally. She has tried cold compress, ibuprofen, Tylenol and BC powders, sometimes with relief. Pt has not taken anything today. She gets nauseous and has episodes of emesis sometimes but not currently. Pt wants to know if she has a tumor. She states she has some memory loss over the last 6 months. She denies visual disturbance.   Past Medical History  Diagnosis Date  . Anxiety   . Acne     on bactrim   Past Surgical History  Procedure Laterality Date  . Tubal ligation     Family History  Problem Relation Age of Onset  . Anesthesia problems Neg Hx    History  Substance Use Topics  . Smoking status: Never Smoker   . Smokeless tobacco: Not on file  . Alcohol Use: No   OB History   Grav Para Term Preterm Abortions TAB SAB Ect Mult Living   4 3 3  0 1 1 0 0 0 3     Review of Systems  Constitutional: Negative for fever and activity change.  HENT: Negative for congestion, rhinorrhea, neck pain and neck stiffness.   Eyes: Negative for photophobia, pain and visual disturbance.  Respiratory: Negative for shortness of breath.   Gastrointestinal: Negative for nausea and vomiting.  Skin: Negative for wound.  Neurological: Positive for headaches. Negative for dizziness, seizures, syncope, speech difficulty, weakness and numbness.  All other systems reviewed and are  negative.    Allergies  Review of patient's allergies indicates no known allergies.  Home Medications   Current Outpatient Rx  Name  Route  Sig  Dispense  Refill  . ALPRAZolam (XANAX) 0.5 MG tablet   Oral   Take 0.5 mg by mouth daily as needed. Takes for anxiety         . ibuprofen (ADVIL,MOTRIN) 200 MG tablet   Oral   Take 400 mg by mouth every 6 (six) hours as needed. Pain          BP 154/87  Pulse 105  Temp(Src) 98.7 F (37.1 C) (Oral)  Resp 16  SpO2 95%  LMP 03/17/2013  Physical Exam  Nursing note and vitals reviewed. Constitutional: She is oriented to person, place, and time. She appears well-developed and well-nourished. No distress.  HENT:  Head: Normocephalic and atraumatic.  Right Ear: Tympanic membrane, external ear and ear canal normal.  Left Ear: Tympanic membrane, external ear and ear canal normal.  Mouth/Throat: Uvula is midline and oropharynx is clear and moist.  No sinus pain.  Eyes: Conjunctivae and EOM are normal. Pupils are equal, round, and reactive to light.  Neck: Neck supple. No tracheal deviation present.  Cardiovascular: Normal rate and regular rhythm.   Pulmonary/Chest: Effort normal and breath sounds normal. No respiratory distress. She has no wheezes. She has no rales.  Musculoskeletal: Normal range of motion.  Lymphadenopathy:  She has no cervical adenopathy.  Neurological: She is alert and oriented to person, place, and time. She has normal strength. No cranial nerve deficit or sensory deficit. She displays a negative Romberg sign.  Perception appropriate. Romberg negative.   Skin: Skin is warm and dry.  Psychiatric: She has a normal mood and affect. Her behavior is normal.    ED Course  Procedures (including critical care time)  DIAGNOSTIC STUDIES: Oxygen Saturation is 95% on RA, adequate by my interpretation.    COORDINATION OF CARE: 8:31 PM-Discussed treatment plan which includes CT scan with pt at bedside and pt agreed  to plan.   Labs Review Labs Reviewed - No data to display Imaging Review Ct Head Wo Contrast  03/26/2013   *RADIOLOGY REPORT*  Clinical Data:  Left sided headache  CT HEAD WITHOUT CONTRAST  Technique:  Contiguous axial images were obtained from the base of the skull through the vertex without contrast  Comparison:  None.  Findings:  The brain has a normal appearance without evidence for hemorrhage, acute infarction, hydrocephalus, or mass lesion.  There is no extra axial fluid collection.  The skull and paranasal sinuses are normal.  IMPRESSION: Normal CT of the head without contrast.   Original Report Authenticated By: Janeece Riggers, M.D.    MDM   1. Headache    CT scan, evaluated, and is normal.  Patient will be [facility] to the [facility]     I personally performed the services described in this documentation, which was scribed in my presence. The recorded information has been reviewed and is accurate.  Arman Filter, NP 03/26/13 2128

## 2013-03-26 NOTE — ED Notes (Signed)
Pt states that she has had a headache for 2 years.  Wants to know if she has a tumor.

## 2013-03-27 NOTE — ED Provider Notes (Signed)
Medical screening examination/treatment/procedure(s) were performed by non-physician practitioner and as supervising physician I was immediately available for consultation/collaboration.  Charmeka Freeburg R. Devinn Hurwitz, MD 03/27/13 0041 

## 2013-09-26 ENCOUNTER — Emergency Department (INDEPENDENT_AMBULATORY_CARE_PROVIDER_SITE_OTHER)
Admission: EM | Admit: 2013-09-26 | Discharge: 2013-09-26 | Disposition: A | Discharge status: Home / Self Care | Payer: Medicaid Other | Source: Home / Self Care | Attending: Emergency Medicine | Admitting: Emergency Medicine

## 2013-09-26 ENCOUNTER — Emergency Department (INDEPENDENT_AMBULATORY_CARE_PROVIDER_SITE_OTHER): Payer: Medicaid Other

## 2013-09-26 ENCOUNTER — Encounter (HOSPITAL_COMMUNITY): Payer: Self-pay | Admitting: Emergency Medicine

## 2013-09-26 DIAGNOSIS — J45909 Unspecified asthma, uncomplicated: Secondary | ICD-10-CM

## 2013-09-26 LAB — POCT RAPID STREP A: Streptococcus, Group A Screen (Direct): NEGATIVE

## 2013-09-26 MED ORDER — ALBUTEROL SULFATE (2.5 MG/3ML) 0.083% IN NEBU
5.0000 mg | INHALATION_SOLUTION | Freq: Once | RESPIRATORY_TRACT | Status: AC
  Administered 2013-09-26: 5 mg via RESPIRATORY_TRACT

## 2013-09-26 MED ORDER — IPRATROPIUM BROMIDE 0.02 % IN SOLN
RESPIRATORY_TRACT | Status: AC
  Filled 2013-09-26: qty 2.5

## 2013-09-26 MED ORDER — IPRATROPIUM BROMIDE 0.02 % IN SOLN
0.5000 mg | Freq: Once | RESPIRATORY_TRACT | Status: AC
  Administered 2013-09-26: 0.5 mg via RESPIRATORY_TRACT

## 2013-09-26 MED ORDER — ALBUTEROL SULFATE HFA 108 (90 BASE) MCG/ACT IN AERS: 1.0000 | INHALATION_SPRAY | RESPIRATORY_TRACT | Status: DC | PRN

## 2013-09-26 MED ORDER — ALBUTEROL SULFATE (2.5 MG/3ML) 0.083% IN NEBU
INHALATION_SOLUTION | RESPIRATORY_TRACT | Status: AC
  Filled 2013-09-26: qty 3

## 2013-09-26 MED ORDER — AZITHROMYCIN 250 MG PO TABS: ORAL_TABLET | ORAL | Status: DC

## 2013-09-26 NOTE — ED Provider Notes (Signed)
Medical screening examination/treatment/procedure(s) were performed by non-physician practitioner and as supervising physician I was immediately available for consultation/collaboration.  Leslee Homeavid Fatina Sprankle, M.D.  Reuben Likesavid C Kimberla Driskill, MD 09/26/13 [phone]

## 2013-09-26 NOTE — Discharge Instructions (Signed)
Bronchitis Bronchitis is inflammation of the airways that extend from the windpipe into the lungs (bronchi). The inflammation often causes mucus to develop, which leads to a cough. If the inflammation becomes severe, it may cause shortness of breath. CAUSES  Bronchitis may be caused by:   Viral infections.   Bacteria.   Cigarette smoke.   Allergens, pollutants, and other irritants.  SIGNS AND SYMPTOMS  The most common symptom of bronchitis is a frequent cough that produces mucus. Other symptoms include:  Fever.   Body aches.   Chest congestion.   Chills.   Shortness of breath.   Sore throat.  DIAGNOSIS  Bronchitis is usually diagnosed through a medical history and physical exam. Tests, such as chest X-rays, are sometimes done to rule out other conditions.  TREATMENT  You may need to avoid contact with whatever caused the problem (smoking, for example). Medicines are sometimes needed. These may include:  Antibiotics. These may be prescribed if the condition is caused by bacteria.  Cough suppressants. These may be prescribed for relief of cough symptoms.   Inhaled medicines. These may be prescribed to help open your airways and make it easier for you to breathe.   Steroid medicines. These may be prescribed for those with recurrent (chronic) bronchitis. HOME CARE INSTRUCTIONS  Get plenty of rest.   Drink enough fluids to keep your urine clear or pale yellow (unless you have a medical condition that requires fluid restriction). Increasing fluids may help thin your secretions and will prevent dehydration.   Only take over-the-counter or prescription medicines as directed by your health care provider.  Only take antibiotics as directed. Make sure you finish them even if you start to feel better.  Avoid secondhand smoke, irritating chemicals, and strong fumes. These will make bronchitis worse. If you are a smoker, quit smoking. Consider using nicotine gum or  skin patches to help control withdrawal symptoms. Quitting smoking will help your lungs heal faster.   Put a cool-mist humidifier in your bedroom at night to moisten the air. This may help loosen mucus. Change the water in the humidifier daily. You can also run the hot water in your shower and sit in the bathroom with the door closed for 5 10 minutes.   Follow up with your health care provider as directed.   Wash your hands frequently to avoid catching bronchitis again or spreading an infection to others.  SEEK MEDICAL CARE IF: Your symptoms do not improve after 1 week of treatment.  SEEK IMMEDIATE MEDICAL CARE IF:  Your fever increases.  You have chills.   You have chest pain.   You have worsening shortness of breath.   You have bloody sputum.  You faint.  You have lightheadedness.  You have a severe headache.   You vomit repeatedly. MAKE SURE YOU:   Understand these instructions.  Will watch your condition.  Will get help right away if you are not doing well or get worse. Document Released: 07/11/2005 Document Revised: 05/01/2013 Document Reviewed: 03/05/2013 [facility] Patient Information 2014 [place]ExitCare, [place].   Your chest xray was without evidence of pneumonia. Given the length of your illness, I would like you to use the medications as they have been prescribed and follow up with your primary care doctor if symptoms do not improve.

## 2013-09-26 NOTE — ED Provider Notes (Signed)
CSN: [id number]     Arrival date & time 09/26/13  1720 History   First MD Initiated Contact with Patient 09/26/13 1813     Chief Complaint  Patient presents with  . URI   (Consider location/radiation/quality/duration/timing/severity/associated sxs/prior Treatment) HPI Comments: Former smoker.   Patient is a 43 y.o. female presenting with URI. The history is provided by the patient.  URI Presenting symptoms: congestion, cough, ear pain, fever, rhinorrhea and sore throat   Severity:  Moderate Onset quality:  Gradual Duration:  3 weeks Timing:  Intermittent Progression:  Unchanged Chronicity:  New Associated symptoms: myalgias and wheezing   Associated symptoms: no arthralgias, no headaches, no neck pain, no sinus pain, no sneezing and no swollen glands   Associated symptoms comment:  +vomiting and diarrhea   Past Medical History  Diagnosis Date  . Anxiety   . Acne     on bactrim   Past Surgical History  Procedure Laterality Date  . Tubal ligation     Family History  Problem Relation Age of Onset  . Anesthesia problems Neg Hx    History  Substance Use Topics  . Smoking status: Never Smoker   . Smokeless tobacco: Not on file  . Alcohol Use: No   OB History   Grav Para Term Preterm Abortions TAB SAB Ect Mult Living   4 3 3  0 1 1 0 0 0 3     Review of Systems  Constitutional: Positive for fever.  HENT: Positive for congestion, ear pain, rhinorrhea and sore throat. Negative for sneezing.   Respiratory: Positive for cough and wheezing.   Musculoskeletal: Positive for myalgias. Negative for arthralgias and neck pain.  Neurological: Negative for headaches.  All other systems reviewed and are negative.    Allergies  Review of patient's allergies indicates no known allergies.  Home Medications   Current Outpatient Rx  Name  Route  Sig  Dispense  Refill  . ALPRAZolam (XANAX) 0.5 MG tablet   Oral   Take 0.5 mg by mouth daily as needed. Takes for anxiety          . albuterol (PROVENTIL HFA;VENTOLIN HFA) 108 (90 BASE) MCG/ACT inhaler   Inhalation   Inhale 1-2 puffs into the lungs every 4 (four) hours as needed for wheezing or shortness of breath (or persistent cough).   1 Inhaler   0   . azithromycin (ZITHROMAX Z-PAK) 250 MG tablet      Take two tabs po QD day 1 then 1 tab po QD days 2-5, then stop.   6 tablet   0   . ibuprofen (ADVIL,MOTRIN) 200 MG tablet   Oral   Take 400 mg by mouth every 6 (six) hours as needed. Pain          BP 125/70  Pulse 90  Temp(Src) 98.2 F (36.8 C) (Oral)  Resp 16  SpO2 98%  LMP 09/07/2013 Physical Exam  Nursing note and vitals reviewed. Constitutional: She is oriented to person, place, and time. She appears well-developed and well-nourished. No distress.  +obese  HENT:  Head: Normocephalic and atraumatic.  Right Ear: Hearing, tympanic membrane, external ear and ear canal normal.  Left Ear: Hearing, tympanic membrane, external ear and ear canal normal.  Nose: Nose normal.  Mouth/Throat: Uvula is midline, oropharynx is clear and moist and mucous membranes are normal.  Eyes: Conjunctivae are normal. Right eye exhibits no discharge. Left eye exhibits no discharge. No scleral icterus.  Neck: Normal range of motion. Neck supple.  Cardiovascular: Normal rate, regular rhythm and normal heart sounds.   Pulmonary/Chest: Effort normal. No respiratory distress. She has wheezes. She has no rales. She exhibits no tenderness.  Mild end expiratory wheezing without prolongation of expiratory phase  Abdominal: Soft. Bowel sounds are normal. She exhibits no distension. There is no tenderness.  Musculoskeletal: Normal range of motion.  Lymphadenopathy:    She has no cervical adenopathy.  Neurological: She is alert and oriented to person, place, and time.  Skin: Skin is warm and dry. No rash noted. No erythema.  Psychiatric: She has a normal mood and affect. Her behavior is normal.    ED Course  Procedures  (including critical care time) Labs Review Labs Reviewed  POCT RAPID STREP A ([facility]ARE ONLY)   Imaging Review Dg Chest 2 View  09/26/2013   CLINICAL DATA:  Cough.  EXAM: CHEST  2 VIEW  COMPARISON:  CT ABD/PELV WO CM dated 08/26/2011; DG CHEST 2 VIEW dated 06/21/2006; DG CHEST 2 VIEW dated 03/03/2006  FINDINGS: Low volume chest. Basilar atelectasis. No airspace disease or effusion. Low volumes accentuate the pulmonary markings in the right infrahilar region. Cardiopericardial silhouette is within normal limits.  IMPRESSION: Low volume chest.   Electronically Signed   By: Andreas Newport M.D.   On: 09/26/2013 19:53     MDM   1. Asthmatic bronchitis    URI: Rapid strep negative. CXR unremarkable. Patient reports later in the visit that she has recently run out of her "inhalers." Given length of illness, will treat with 5 day course of azithromycin for asthmatic bronchitis and provide Rx for albuterol MDI. Advised patient to follow up with her PCP if symptoms do not begin to improve.     Jess Barters Avinger, [place] 09/26/13 2007

## 2013-09-26 NOTE — ED Notes (Signed)
C/o  Nonproductive cough.  Fever.  Chest congestion.  Vomiting.  Diarrhea.   Body aches.  Sore throat.   No relief with otc meds.   Symptoms on going before 2/14

## 2013-09-28 LAB — CULTURE, GROUP A STREP

## 2013-12-01 ENCOUNTER — Emergency Department (HOSPITAL_COMMUNITY)
Admission: EM | Admit: 2013-12-01 | Discharge: 2013-12-01 | Disposition: A | Discharge status: Home / Self Care | Payer: Medicaid Other | Attending: Emergency Medicine | Admitting: Emergency Medicine

## 2013-12-01 ENCOUNTER — Encounter (HOSPITAL_COMMUNITY): Payer: Self-pay | Admitting: Emergency Medicine

## 2013-12-01 DIAGNOSIS — Z792 Long term (current) use of antibiotics: Secondary | ICD-10-CM | POA: Insufficient documentation

## 2013-12-01 DIAGNOSIS — J029 Acute pharyngitis, unspecified: Secondary | ICD-10-CM | POA: Insufficient documentation

## 2013-12-01 DIAGNOSIS — M791 Myalgia, unspecified site: Secondary | ICD-10-CM

## 2013-12-01 DIAGNOSIS — F411 Generalized anxiety disorder: Secondary | ICD-10-CM | POA: Insufficient documentation

## 2013-12-01 DIAGNOSIS — K146 Glossodynia: Secondary | ICD-10-CM

## 2013-12-01 DIAGNOSIS — M79609 Pain in unspecified limb: Secondary | ICD-10-CM | POA: Insufficient documentation

## 2013-12-01 DIAGNOSIS — Z872 Personal history of diseases of the skin and subcutaneous tissue: Secondary | ICD-10-CM | POA: Insufficient documentation

## 2013-12-01 DIAGNOSIS — K1370 Unspecified lesions of oral mucosa: Secondary | ICD-10-CM | POA: Insufficient documentation

## 2013-12-01 LAB — RAPID STREP SCREEN (MED CTR MEBANE ONLY): STREPTOCOCCUS, GROUP A SCREEN (DIRECT): NEGATIVE

## 2013-12-01 MED ORDER — DEXAMETHASONE SODIUM PHOSPHATE 10 MG/ML IJ SOLN
10.0000 mg | Freq: Once | INTRAMUSCULAR | Status: AC
  Administered 2013-12-01: 10 mg via INTRAMUSCULAR
  Filled 2013-12-01: qty 1

## 2013-12-01 MED ORDER — HYDROCODONE-ACETAMINOPHEN 7.5-325 MG/15ML PO SOLN: 15.0000 mL | Freq: Three times a day (TID) | ORAL | Status: DC | PRN

## 2013-12-01 MED ORDER — NAPROXEN 500 MG PO TABS: 500.0000 mg | ORAL_TABLET | Freq: Two times a day (BID) | ORAL | Status: DC

## 2013-12-01 NOTE — ED Notes (Signed)
Onset Friday sore throat, headache, body aches and white bump on left side of tongue.  Painful to swallow.  No difficulty swallowing.  No other s/s noted.

## 2013-12-01 NOTE — Discharge Instructions (Signed)
Recommend naproxen for pain control. You may use Hycotuss prescribed for sore throat. Do not drive or drink alcohol after taking this medication as it may make you drowsy and impair your judgment. Followup with your primary care provider in the next 24-48 hours. Recommend that you increase your fluid intake with water and: Avoid sugary drinks. Return if symptoms worsen.  Viral Pharyngitis Viral pharyngitis is a viral infection that produces redness, pain, and swelling (inflammation) of the throat. It can spread from person to person (contagious). CAUSES Viral pharyngitis is caused by inhaling a large amount of certain germs called viruses. Many different viruses cause viral pharyngitis. SYMPTOMS Symptoms of viral pharyngitis include:  Sore throat.  Tiredness.  Stuffy nose.  Low-grade fever.  Congestion.  Cough. TREATMENT Treatment includes rest, drinking plenty of fluids, and the use of over-the-counter medication (approved by your caregiver). HOME CARE INSTRUCTIONS   Drink enough fluids to keep your urine clear or pale yellow.  Eat soft, cold foods such as ice cream, frozen ice pops, or gelatin dessert.  Gargle with warm salt water (1 tsp salt per 1 qt of water).  If over age 42, throat lozenges may be used safely.  Only take over-the-counter or prescription medicines for pain, discomfort, or fever as directed by your caregiver. Do not take aspirin. To help prevent spreading viral pharyngitis to others, avoid:  Mouth-to-mouth contact with others.  Sharing utensils for eating and drinking.  Coughing around others. SEEK MEDICAL CARE IF:   You are better in a few days, then become worse.  You have a fever or pain not helped by pain medicines.  There are any other changes that concern you. Document Released: 04/20/2005 Document Revised: 10/03/2011 Document Reviewed: 09/16/2010 [facility] CampusExit[facility] Patient Information 2014 [place]ExitCare, [place]LL[place]. Salt Water Gargle This solution will help  make your mouth and throat feel better. HOME CARE INSTRUCTIONS   Mix 1 teaspoon of salt in 8 ounces of warm water.  Gargle with this solution as much or often as you need or as directed. Swish and gargle gently if you have any sores or wounds in your mouth.  Do not swallow this mixture. Document Released: 04/14/2004 Document Revised: 10/03/2011 Document Reviewed: 09/05/2008 [facility] Patient Information 2014 [place]ExitCare, [place]LL[place].

## 2013-12-01 NOTE — ED Provider Notes (Signed)
CSN: [id number]     Arrival date & time 12/01/13  1330 History  This chart was scribed for non-physician practitioner, Antony MaduraKelly Thyra Yinger, PA-C working with Shanna CiscoMegan E Docherty, MD by Greggory StallionKayla Andersen, ED scribe. This patient was seen in room TR06C/TR06C and the patient's care was started at 1:55 PM.   Chief Complaint  Patient presents with  . Sore Throat  . Mouth Lesions  . Generalized Body Aches   The history is provided by the patient. No language interpreter was used.   HPI Comments: Kathryn Moon is a 43 y.o. female who presents to the Emergency Department complaining of gradual onset sore throat that started 3 days ago. Eating worsens the pain. Pt is also complaining of a sore on her tongue, headache and right leg pain. She has taken ibuprofen with little relief. Denies fever, congestion, rhinorrhea, inability to swallow, SOB, emesis, neck stiffness, back pain, saddle paraesthesia, loss of sensation in extremities. Denies trauma or injury to back.   Past Medical History  Diagnosis Date  . Anxiety   . Acne     on bactrim   Past Surgical History  Procedure Laterality Date  . Tubal ligation     Family History  Problem Relation Age of Onset  . Anesthesia problems Neg Hx    History  Substance Use Topics  . Smoking status: Never Smoker   . Smokeless tobacco: Not on file  . Alcohol Use: No   OB History   Grav Para Term Preterm Abortions TAB SAB Ect Mult Living   4 3 3  0 1 1 0 0 0 3     Review of Systems  Constitutional: Negative for fever.  HENT: Positive for mouth sores and sore throat. Negative for congestion, rhinorrhea and trouble swallowing.   Respiratory: Negative for shortness of breath.   Gastrointestinal: Negative for vomiting.  Musculoskeletal: Positive for myalgias. Negative for back pain and neck stiffness.  Neurological: Positive for headaches. Negative for numbness.  All other systems reviewed and are negative.  Allergies  Review of patient's allergies indicates  no known allergies.  Home Medications   Prior to Admission medications   Medication Sig Start Date End Date Taking? Authorizing Provider  albuterol (PROVENTIL HFA;VENTOLIN HFA) 108 (90 BASE) MCG/ACT inhaler Inhale 1-2 puffs into the lungs every 4 (four) hours as needed for wheezing or shortness of breath (or persistent cough). 09/26/13   Ardis RowanJennifer Lee Presson, PA  ALPRAZolam Prudy Feeler(XANAX) 0.5 MG tablet Take 0.5 mg by mouth daily as needed. Takes for anxiety    Historical Provider, MD  azithromycin (ZITHROMAX Z-PAK) 250 MG tablet Take two tabs po QD day 1 then 1 tab po QD days 2-5, then stop. 09/26/13   Ardis RowanJennifer Lee Presson, PA  ibuprofen (ADVIL,MOTRIN) 200 MG tablet Take 400 mg by mouth every 6 (six) hours as needed. Pain    Historical Provider, MD   BP 135/73  Pulse 89  Temp(Src) 98.4 F (36.9 C) (Oral)  Resp 18  Ht 5\' 1"  (1.549 m)  Wt 263 lb 5 oz (119.438 kg)  BMI 49.78 kg/m2  SpO2 98%  Physical Exam  Nursing note and vitals reviewed. Constitutional: She is oriented to person, place, and time. She appears well-developed and well-nourished. No distress.  Non toxic. Non septic appearing. Pleasant and conversing.   HENT:  Head: Normocephalic and atraumatic.  Mouth/Throat: Normal dentition. No uvula swelling. Posterior oropharyngeal erythema (mild) present. No oropharyngeal exudate or posterior oropharyngeal edema.  Sore to left lateral tongue. No lesions on  buccal mucosa. No bleeding. Pt tolerating secretions without difficulty or drooling.   Eyes: Conjunctivae and EOM are normal. No scleral icterus.  Neck: Normal range of motion.  No stridor.  Cardiovascular: Normal rate, regular rhythm and normal heart sounds.   DP and PT pulses are 2+ bilaterally.   Pulmonary/Chest: Effort normal and breath sounds normal. No stridor. No respiratory distress. She has no wheezes. She has no rales.  Musculoskeletal: Normal range of motion.  No midline spine tenderness.   Neurological: She is alert and  oriented to person, place, and time.  Reflex Scores:      Patellar reflexes are 2+ on the right side and 2+ on the left side.      Achilles reflexes are 2+ on the right side and 2+ on the left side. DTRs normal and equal bilaterally. Sensation to light touch intact. Pt ambulates with normal gait.   Skin: Skin is warm and dry. No rash noted. She is not diaphoretic. No erythema. No pallor.  Psychiatric: She has a normal mood and affect. Her behavior is normal.    ED Course  Procedures (including critical care time)  DIAGNOSTIC STUDIES: Oxygen Saturation is 99% on RA, normal by my interpretation.    COORDINATION OF CARE: 2:05 PM-Discussed treatment plan which includes rapid strep test and decadron with pt at bedside and pt agreed to plan. Advised pt to follow up with her PCP.  Labs Review Labs Reviewed  RAPID STREP SCREEN  CULTURE, GROUP A STREP    Imaging Review No results found.   EKG Interpretation None      MDM   Final diagnoses:  Viral pharyngitis  Muscle soreness  Tongue sore    Patient with multiple complaints, but primarily complaining of sore throat. Patient with mild posterior oropharyngeal erythema. Uvula midline without evidence of peritonsillar abscess. No significant tonsillar enlargement or exudates. Patient tolerating secretions without difficulty or drooling. No voice changes. No stridor. Rapid strep negative. Likely that symptoms are secondary to a viral process. She also complaints of bilateral leg pain. No trauma or injuries to the area, red flags, or signs concerning for cauda equina. No physical exam findings to suggest DVT. Symptoms likely muscular in nature. Will prescribe Hycet and Naproxen for management of symptoms. Patient discharged in good condition with instructions followup with her primary care doctor. Return precautions provided and patient agreeable to plan with no unaddressed concerns.  I personally performed the services described in this  documentation, which was scribed in my presence. The recorded information has been reviewed and is accurate.   Filed Vitals:   12/01/13 1334 12/01/13 1337 12/01/13 1443  BP:  134/76 135/73  Pulse:  95 89  Temp:  98.4 F (36.9 C)   TempSrc:  Oral   Resp:  18 18  Height: 5\' 1"  (1.549 m)    Weight: 263 lb 5 oz (119.438 kg)    SpO2:  99% 98%     Antony MaduraKelly Karle Desrosier, PA-C 12/01/13 1613

## 2013-12-01 NOTE — ED Notes (Signed)
Patient reports she has had sore throat, a sore on her tongue, body aches, and headache x 3 days.  Patient denies fever.  Patient states her mother passed away on Friday.  Patient states she is having some difficulty swallowing

## 2013-12-02 NOTE — ED Provider Notes (Signed)
Medical screening examination/treatment/procedure(s) were performed by non-physician practitioner and as supervising physician I was immediately available for consultation/collaboration.   Megan E Docherty, MD 12/02/13 1659 

## 2013-12-03 LAB — CULTURE, GROUP A STREP

## 2014-03-13 ENCOUNTER — Other Ambulatory Visit (HOSPITAL_COMMUNITY)
Admission: RE | Admit: 2014-03-13 | Discharge: 2014-03-13 | Disposition: A | Discharge status: Home / Self Care | Payer: Medicaid Other | Source: Ambulatory Visit | Attending: Family Medicine | Admitting: Family Medicine

## 2014-03-13 ENCOUNTER — Other Ambulatory Visit: Payer: Self-pay | Admitting: Family Medicine

## 2014-03-13 DIAGNOSIS — Z124 Encounter for screening for malignant neoplasm of cervix: Secondary | ICD-10-CM | POA: Diagnosis present

## 2014-03-18 LAB — CYTOLOGY - PAP

## 2014-04-09 ENCOUNTER — Other Ambulatory Visit: Payer: Self-pay

## 2014-04-09 DIAGNOSIS — Z1231 Encounter for screening mammogram for malignant neoplasm of breast: Secondary | ICD-10-CM

## 2014-04-29 ENCOUNTER — Ambulatory Visit: Payer: Medicaid Other

## 2014-05-26 ENCOUNTER — Encounter (HOSPITAL_COMMUNITY): Payer: Self-pay | Admitting: Emergency Medicine

## 2015-08-25 ENCOUNTER — Other Ambulatory Visit: Payer: Self-pay | Admitting: Gastroenterology

## 2015-12-12 ENCOUNTER — Ambulatory Visit (HOSPITAL_COMMUNITY)
Admission: EM | Admit: 2015-12-12 | Discharge: 2015-12-12 | Disposition: A | Discharge status: Home / Self Care | Payer: Medicaid Other | Attending: Emergency Medicine | Admitting: Emergency Medicine

## 2015-12-12 ENCOUNTER — Encounter (HOSPITAL_COMMUNITY): Payer: Self-pay | Admitting: Emergency Medicine

## 2015-12-12 DIAGNOSIS — H6092 Unspecified otitis externa, left ear: Secondary | ICD-10-CM

## 2015-12-12 MED ORDER — TRAMADOL HCL 50 MG PO TABS: 50.0000 mg | ORAL_TABLET | Freq: Four times a day (QID) | ORAL | Status: DC | PRN

## 2015-12-12 MED ORDER — PREDNISONE 20 MG PO TABS: 60.0000 mg | ORAL_TABLET | Freq: Every day | ORAL | Status: DC

## 2015-12-12 NOTE — Discharge Instructions (Signed)
Otitis Externa Otitis externa is a germ infection in the outer ear. The outer ear is the area from the eardrum to the outside of the ear. Otitis externa is sometimes called "swimmer's ear." HOME CARE  Put drops in the ear as told by your doctor.  Only take medicine as told by your doctor.  If you have diabetes, your doctor may give you more directions. Follow your doctor's directions.  Keep all doctor visits as told. To avoid another infection:  Keep your ear dry. Use the corner of a towel to dry your ear after swimming or bathing.  Avoid scratching or putting things inside your ear.  Avoid swimming in lakes, dirty water, or pools that use a chemical called chlorine poorly.  You may use ear drops after swimming. Combine equal amounts of white vinegar and alcohol in a bottle. Put 3 or 4 drops in each ear. GET HELP IF:   You have a fever.  Your ear is still red, puffy (swollen), or painful after 3 days.  You still have yellowish-white fluid (pus) coming from the ear after 3 days.  Your redness, puffiness, or pain gets worse.  You have a really bad headache.  You have redness, puffiness, pain, or tenderness behind your ear. MAKE SURE YOU:   Understand these instructions.  Will watch your condition.  Will get help right away if you are not doing well or get worse.   This information is not intended to replace advice given to you by your health care provider. Make sure you discuss any questions you have with your health care provider.   Document Released: 12/28/2007 Document Revised: 08/01/2014 Document Reviewed: 07/28/2011 Elsevier Interactive Patient Education 2016 [employer]  

## 2015-12-12 NOTE — ED Notes (Signed)
Complains of left ear pain.  Patient reports seeing her doctor yesterday for the same, but the pain is not any better.

## 2015-12-12 NOTE — ED Provider Notes (Signed)
CSN: [id number]     Arrival date & time 12/12/15  4098 History   First MD Initiated Contact with Patient 12/12/15 2007     Chief Complaint  Patient presents with  . Otalgia   (Consider location/radiation/quality/duration/timing/severity/associated sxs/prior Treatment) Patient is a 45 y.o. female presenting with ear pain.  Otalgia Associated symptoms: no congestion, no ear discharge, no fever, no rash and no sore throat      Patient is a 45 year old female presenting today with complaints of ear pain for the past 2 days. Patient states extensive severe that she's been unable to sleep. Patient was seen at an urgent care facility yesterday and was prescribed Ciprodex. Denies significant medical history other than chronic otitis externa. Denies allergy to medications.  Past Medical History  Diagnosis Date  . Anxiety   . Acne     on bactrim   Past Surgical History  Procedure Laterality Date  . Tubal ligation     Family History  Problem Relation Age of Onset  . Anesthesia problems Neg Hx    Social History  Substance Use Topics  . Smoking status: Never Smoker   . Smokeless tobacco: None  . Alcohol Use: No   OB History    Gravida Para Term Preterm AB TAB SAB Ectopic Multiple Living   0 1 1 0 0 0 3     Review of Systems  Constitutional: Negative.  Negative for fever and chills.  HENT: Positive for ear pain. Negative for congestion, ear discharge, sinus pressure, sneezing and sore throat.   Eyes: Negative.   Respiratory: Negative for shortness of breath and wheezing.   Cardiovascular: Negative.   Gastrointestinal: Negative.   Endocrine: Negative.   Genitourinary: Negative.   Musculoskeletal: Negative.   Skin: Negative.  Negative for pallor and rash.  Allergic/Immunologic: Positive for environmental allergies. Negative for food allergies and immunocompromised state.  Neurological: Negative.   Hematological: Negative.   Psychiatric/Behavioral: Negative.      Allergies  Review of patient's allergies indicates no known allergies.  Home Medications   Prior to Admission medications   Medication Sig Start Date End Date Taking? Authorizing Provider  fluticasone (FLONASE) 50 MCG/ACT nasal spray Place into both nostrils daily.   Yes Historical Provider, MD  OVER THE COUNTER MEDICATION EAR drop   Yes Historical Provider, MD  ALPRAZolam (XANAX) 0.5 MG tablet Take 0.5 mg by mouth daily as needed. Takes for anxiety    Historical Provider, MD  azithromycin (ZITHROMAX Z-PAK) 250 MG tablet Take two tabs po QD day 1 then 1 tab po QD days 2-5, then stop. 09/26/13   Mathis Fare Presson, PA  cetirizine (ZYRTEC) 10 MG tablet Take 10 mg by mouth as needed for allergies.    Historical Provider, MD  HYDROcodone-acetaminophen (HYCET) 7.5-325 mg/15 ml solution Take 15 mLs by mouth every 8 (eight) hours as needed for moderate pain. 12/01/13   Antony Madura, PA-C  ibuprofen (ADVIL,MOTRIN) 200 MG tablet Take 400 mg by mouth every 6 (six) hours as needed. Pain    Historical Provider, MD  naproxen (NAPROSYN) 500 MG tablet Take 1 tablet (500 mg total) by mouth 2 (two) times daily. 12/01/13   Antony Madura, PA-C  Omega-3 Fatty Acids (FISH OIL) 1000 MG CAPS Take 1 capsule by mouth daily.    Historical Provider, MD  predniSONE (DELTASONE) 20 MG tablet Take 3 tablets (60 mg total) by mouth daily. 12/12/15   Servando Salina, NP  sulfamethoxazole-trimethoprim (BACTRIM DS) 800-160 MG  per tablet Take 1 tablet by mouth as needed (acne).    Historical Provider, MD  traMADol (ULTRAM) 50 MG tablet Take 1 tablet (50 mg total) by mouth every 6 (six) hours as needed. 12/12/15   Servando Salinaatherine H Rossi, NP   Meds Ordered and Administered this Visit  Medications - No data to display  BP 135/87 mmHg  Pulse 83  Temp(Src) 98.1 F (36.7 C) (Oral)  Resp 18  SpO2 99% No data found.   Physical Exam  Constitutional: She appears well-developed and well-nourished. No distress.  HENT:  Head:  Normocephalic and atraumatic.  Right Ear: External ear normal.  Nose: Nose normal.  Mouth/Throat: Oropharynx is clear and moist.  Inflammation and discharge noted in L ear canal. No mastoid tenderness.   Eyes: Conjunctivae are normal. Pupils are equal, round, and reactive to light. Right eye exhibits no discharge. Left eye exhibits no discharge. No scleral icterus.  Neck: Normal range of motion. Neck supple. No thyromegaly present.  Cardiovascular: Normal rate, regular rhythm, normal heart sounds and intact distal pulses.  Exam reveals no gallop and no friction rub.   No murmur heard. Pulmonary/Chest: Effort normal and breath sounds normal. No respiratory distress. She has no wheezes. She has no rales. She exhibits no tenderness.  Skin: She is not diaphoretic.  Nursing note and vitals reviewed.   ED Course  Procedures (including critical care time)  Labs Review Labs Reviewed - No data to display  Imaging Review No results found.    MDM   1. Otitis externa, left    Meds ordered this encounter  Medications  . OVER THE COUNTER MEDICATION    Sig: EAR drop  . fluticasone (FLONASE) 50 MCG/ACT nasal spray    Sig: Place into both nostrils daily.  . predniSONE (DELTASONE) 20 MG tablet    Sig: Take 3 tablets (60 mg total) by mouth daily.    Dispense:  15 tablet    Refill:  0  . traMADol (ULTRAM) 50 MG tablet    Sig: Take 1 tablet (50 mg total) by mouth every 6 (six) hours as needed.    Dispense:  15 tablet    Refill:  0   Patient to keep taking the ciprodex.  Given tramadol and prednisone for comfort. To follow up with ENT if no improvement or worsening of symptoms on Monday.  The patient verbalizes understanding and agrees to plan of care.      Servando Salinaatherine H Rossi, NP 12/12/15 2109

## 2016-01-01 ENCOUNTER — Other Ambulatory Visit: Payer: Self-pay | Admitting: Family Medicine

## 2016-01-01 DIAGNOSIS — Z1231 Encounter for screening mammogram for malignant neoplasm of breast: Secondary | ICD-10-CM

## 2016-01-22 ENCOUNTER — Ambulatory Visit
Admission: RE | Admit: 2016-01-22 | Discharge: 2016-01-22 | Disposition: A | Discharge status: Home / Self Care | Payer: Medicaid Other | Source: Ambulatory Visit | Attending: Family Medicine | Admitting: Family Medicine

## 2016-01-22 DIAGNOSIS — Z1231 Encounter for screening mammogram for malignant neoplasm of breast: Secondary | ICD-10-CM

## 2016-07-26 ENCOUNTER — Ambulatory Visit (HOSPITAL_COMMUNITY)
Admission: EM | Admit: 2016-07-26 | Discharge: 2016-07-26 | Disposition: A | Discharge status: Home / Self Care | Payer: Medicaid Other | Attending: Family Medicine | Admitting: Family Medicine

## 2016-07-26 ENCOUNTER — Encounter (HOSPITAL_COMMUNITY): Payer: Self-pay | Admitting: Family Medicine

## 2016-07-26 DIAGNOSIS — J40 Bronchitis, not specified as acute or chronic: Secondary | ICD-10-CM | POA: Diagnosis not present

## 2016-07-26 DIAGNOSIS — J9801 Acute bronchospasm: Secondary | ICD-10-CM

## 2016-07-26 MED ORDER — METHYLPREDNISOLONE SODIUM SUCC 125 MG IJ SOLR
INTRAMUSCULAR | Status: AC
  Filled 2016-07-26: qty 2

## 2016-07-26 MED ORDER — AZITHROMYCIN 250 MG PO TABS: 250.0000 mg | ORAL_TABLET | Freq: Every day | ORAL | 0 refills | Status: DC

## 2016-07-26 MED ORDER — BENZONATATE 100 MG PO CAPS: 200.0000 mg | ORAL_CAPSULE | Freq: Three times a day (TID) | ORAL | 0 refills | Status: DC | PRN

## 2016-07-26 MED ORDER — ALBUTEROL SULFATE (2.5 MG/3ML) 0.083% IN NEBU: 2.5000 mg | INHALATION_SOLUTION | Freq: Four times a day (QID) | RESPIRATORY_TRACT | 0 refills | Status: DC | PRN

## 2016-07-26 MED ORDER — PREDNISONE 10 MG (21) PO TBPK: ORAL_TABLET | ORAL | 0 refills | Status: DC

## 2016-07-26 MED ORDER — METHYLPREDNISOLONE SODIUM SUCC 125 MG IJ SOLR
125.0000 mg | Freq: Once | INTRAMUSCULAR | Status: AC
  Administered 2016-07-26: 125 mg via INTRAMUSCULAR

## 2016-07-26 NOTE — ED Provider Notes (Signed)
CSN: [id number]     Arrival date & time 07/26/16  1809 History   None    Chief Complaint  Patient presents with  . Cough   (Consider location/radiation/quality/duration/timing/severity/associated sxs/prior Treatment) Patient c/o cough and wheezing and SOB.  She states she has a hx of asthmatic bronchitis.   The history is provided by the patient.  Cough  Cough characteristics:  Productive Sputum characteristics:  White Severity:  Moderate Onset quality:  Sudden Duration:  9 weeks Timing:  Constant Progression:  Worsening Chronicity:  Recurrent Smoker: no   Context: upper respiratory infection   Relieved by:  Beta-agonist inhaler Associated symptoms: chest pain and wheezing     Past Medical History:  Diagnosis Date  . Acne    on bactrim  . Anxiety    Past Surgical History:  Procedure Laterality Date  . TUBAL LIGATION     Family History  Problem Relation Age of Onset  . Anesthesia problems Neg Hx    Social History  Substance Use Topics  . Smoking status: Never Smoker  . Smokeless tobacco: Never Used  . Alcohol use No   OB History    Gravida Para Term Preterm AB Living   4 3 3  0 1 3   SAB TAB Ectopic Multiple Live Births   0 1 0 0       Review of Systems  Constitutional: Positive for fatigue.  HENT: Negative.   Eyes: Negative.   Respiratory: Positive for cough and wheezing.   Cardiovascular: Positive for chest pain.  Gastrointestinal: Negative.   Endocrine: Negative.   Genitourinary: Negative.   Musculoskeletal: Negative.   Allergic/Immunologic: Negative.   Neurological: Negative.   Hematological: Negative.   Psychiatric/Behavioral: Negative.     Allergies  Patient has no known allergies.  Home Medications   Prior to Admission medications   Medication Sig Start Date End Date Taking? Authorizing Provider  ALPRAZolam Prudy Feeler(XANAX) 0.5 MG tablet Take 0.5 mg by mouth daily as needed. Takes for anxiety    Historical Provider, MD  azithromycin (ZITHROMAX  Z-PAK) 250 MG tablet Take two tabs po QD day 1 then 1 tab po QD days 2-5, then stop. 09/26/13   Mathis FareJennifer Lee H Presson, PA  azithromycin (ZITHROMAX) 250 MG tablet Take 1 tablet (250 mg total) by mouth daily. Take first 2 tablets together, then 1 every day until finished. 07/26/16   Deatra CanterWilliam J Brigham Cobbins, FNP  benzonatate (TESSALON) 100 MG capsule Take 2 capsules (200 mg total) by mouth 3 (three) times daily as needed for cough. 07/26/16   Deatra CanterWilliam J Javed Cotto, FNP  cetirizine (ZYRTEC) 10 MG tablet Take 10 mg by mouth as needed for allergies.    Historical Provider, MD  fluticasone (FLONASE) 50 MCG/ACT nasal spray Place into both nostrils daily.    Historical Provider, MD  HYDROcodone-acetaminophen (HYCET) 7.5-325 mg/15 ml solution Take 15 mLs by mouth every 8 (eight) hours as needed for moderate pain. 12/01/13   Antony MaduraKelly Humes, PA-C  ibuprofen (ADVIL,MOTRIN) 200 MG tablet Take 400 mg by mouth every 6 (six) hours as needed. Pain    Historical Provider, MD  naproxen (NAPROSYN) 500 MG tablet Take 1 tablet (500 mg total) by mouth 2 (two) times daily. 12/01/13   Antony MaduraKelly Humes, PA-C  Omega-3 Fatty Acids (FISH OIL) 1000 MG CAPS Take 1 capsule by mouth daily.    Historical Provider, MD  OVER THE COUNTER MEDICATION EAR drop    Historical Provider, MD  predniSONE (DELTASONE) 20 MG tablet Take 3 tablets (60  mg total) by mouth daily. 12/12/15   Servando Salina, NP  predniSONE (STERAPRED UNI-PAK 21 TAB) 10 MG (21) TBPK tablet Take 6-5-4-3-2-1 07/26/16   Deatra Canter, FNP  sulfamethoxazole-trimethoprim (BACTRIM DS) 800-160 MG per tablet Take 1 tablet by mouth as needed (acne).    Historical Provider, MD  traMADol (ULTRAM) 50 MG tablet Take 1 tablet (50 mg total) by mouth every 6 (six) hours as needed. 12/12/15   Servando Salina, NP   Meds Ordered and Administered this Visit   Medications  methylPREDNISolone sodium succinate (SOLU-MEDROL) 125 mg/2 mL injection 125 mg (not administered)    BP 157/96 (BP Location: Left Arm)    Pulse 96   Temp 98.4 F (36.9 C) (Oral)   Resp 14   SpO2 98%  No data found.   Physical Exam  Constitutional: She is oriented to person, place, and time. She appears well-developed and well-nourished.  HENT:  Head: Normocephalic and atraumatic.  Right Ear: External ear normal.  Left Ear: External ear normal.  Mouth/Throat: Oropharynx is clear and moist.  Eyes: EOM are normal. Pupils are equal, round, and reactive to light.  Neck: Normal range of motion. Neck supple.  Cardiovascular: Normal rate, regular rhythm and normal heart sounds.   Pulmonary/Chest: Effort normal. She has wheezes.  Abdominal: Soft. Bowel sounds are normal.  Neurological: She is alert and oriented to person, place, and time.  Nursing note and vitals reviewed.   Urgent Care Course   Clinical Course     Procedures (including critical care time)  Labs Review Labs Reviewed - No data to display  Imaging Review No results found.   Visual Acuity Review  Right Eye Distance:   Left Eye Distance:   Bilateral Distance:    Right Eye Near:   Left Eye Near:    Bilateral Near:         MDM   1. Bronchitis   2. Bronchospasm    Solumedrol 125mg  IM Prednisone 10mg  6-5-4-3-2-1 po qd #21 Zpak as directed Tessalon Perles 100mg  2 po tid prn #21  Push po fluids, rest, tylenol and motrin otc prn as directed for fever, arthralgias, and myalgias.  Follow up prn if sx's continue or persist.    Deatra Canter, FNP 07/26/16 1915    Anselm Pancoast [place], FNP 07/26/16 [phone]

## 2016-07-26 NOTE — ED Triage Notes (Signed)
Pt here for cough x 9 weeks. sts rattling in her chest. sts asthma and bronchitis.

## 2017-06-13 ENCOUNTER — Other Ambulatory Visit: Payer: Self-pay | Admitting: Family Medicine

## 2017-06-13 DIAGNOSIS — Z1231 Encounter for screening mammogram for malignant neoplasm of breast: Secondary | ICD-10-CM

## 2017-07-16 ENCOUNTER — Emergency Department (HOSPITAL_COMMUNITY)
Admission: EM | Admit: 2017-07-16 | Discharge: 2017-07-16 | Disposition: A | Discharge status: Home / Self Care | Payer: Self-pay | Attending: Emergency Medicine | Admitting: Emergency Medicine

## 2017-07-16 ENCOUNTER — Emergency Department (HOSPITAL_COMMUNITY): Payer: Self-pay

## 2017-07-16 ENCOUNTER — Other Ambulatory Visit: Payer: Self-pay

## 2017-07-16 DIAGNOSIS — H66005 Acute suppurative otitis media without spontaneous rupture of ear drum, recurrent, left ear: Secondary | ICD-10-CM | POA: Insufficient documentation

## 2017-07-16 DIAGNOSIS — E119 Type 2 diabetes mellitus without complications: Secondary | ICD-10-CM | POA: Insufficient documentation

## 2017-07-16 DIAGNOSIS — J4521 Mild intermittent asthma with (acute) exacerbation: Secondary | ICD-10-CM | POA: Insufficient documentation

## 2017-07-16 MED ORDER — IPRATROPIUM-ALBUTEROL 0.5-2.5 (3) MG/3ML IN SOLN
3.0000 mL | Freq: Once | RESPIRATORY_TRACT | Status: AC
  Administered 2017-07-16: 3 mL via RESPIRATORY_TRACT
  Filled 2017-07-16: qty 3

## 2017-07-16 MED ORDER — AMOXICILLIN-POT CLAVULANATE 875-125 MG PO TABS: 1.0000 | ORAL_TABLET | Freq: Two times a day (BID) | ORAL | 0 refills | Status: AC

## 2017-07-16 MED ORDER — ALBUTEROL SULFATE (2.5 MG/3ML) 0.083% IN NEBU: 2.5000 mg | INHALATION_SOLUTION | Freq: Four times a day (QID) | RESPIRATORY_TRACT | 1 refills | Status: DC | PRN

## 2017-07-16 MED ORDER — PROMETHAZINE-DM 6.25-15 MG/5ML PO SYRP: 5.0000 mL | ORAL_SOLUTION | Freq: Four times a day (QID) | ORAL | 0 refills | Status: DC | PRN

## 2017-07-16 MED ORDER — KETOROLAC TROMETHAMINE 60 MG/2ML IM SOLN
60.0000 mg | Freq: Once | INTRAMUSCULAR | Status: AC
  Administered 2017-07-16: 60 mg via INTRAMUSCULAR
  Filled 2017-07-16: qty 2

## 2017-07-16 NOTE — ED Provider Notes (Signed)
[facility]-EMERGENCY DEPT Provider Note   CSN: [id number] Arrival date & time: 07/16/17  1114     History   Chief Complaint Chief Complaint  Patient presents with  . Cough  . Otalgia    HPI Kathryn Moon is a 46 y.o. female with a h/o of asthma and DM presents to the emergency department with a chief complaint of left otalgia that began this morning.  No aggravating or alleviating factors. she reports the pain is 12/10, constant, characterized as sharp, and radiates down her left jaw.  She treated her symptoms by placing vinegar and rubbing alcohol in the ear without improvement, states that placing a warm compress over the left side of the face improves her pain.  She also reports a productive cough with white and clear sputum that began 1 week ago with associated nasal congestion, postnasal drip, and worsening dyspnea over the last 2-3 days.  She denies right otalgia, discharge from the ear, rash, headache, tinnitus, change in hearing, sinus pain or pressure, sore throat, chest pain, nausea, vomiting, diarrhea, abdominal pain, body aches, fever, chills, or dental pain.   She reports that she took 2 albuterol nebulizer treatments yesterday for her shortness of breath without improvement.  She reports a history of recurrent bilateral ear infections since she has been about 46 years old.  She has a history of tympanostomy tubes.  She is a non-smoker.  She has not had a flu shot this year.  The history is provided by the patient. No language interpreter was used.   Past Medical History:  Diagnosis Date  . Acne    on bactrim  . Anxiety     There are no active problems to display for this patient.   Past Surgical History:  Procedure Laterality Date  . TUBAL LIGATION      OB History    Gravida Para Term Preterm AB Living   4 3 3  0 1 3   SAB TAB Ectopic Multiple Live Births   0 1 0 0         Home Medications    Prior to Admission medications     Medication Sig Start Date End Date Taking? Authorizing Provider  albuterol (PROVENTIL) (2.5 MG/3ML) 0.083% nebulizer solution Take 3 mLs (2.5 mg total) by nebulization every 6 (six) hours as needed for wheezing or shortness of breath. 07/26/16   Deatra Canter, FNP  albuterol (PROVENTIL) (2.5 MG/3ML) 0.083% nebulizer solution Take 3 mLs (2.5 mg total) by nebulization every 6 (six) hours as needed for wheezing or shortness of breath. 07/16/17   Kjersti Dittmer A, PA-C  ALPRAZolam (XANAX) 0.5 MG tablet Take 0.5 mg by mouth daily as needed. Takes for anxiety    [provider]  amoxicillin-clavulanate (AUGMENTIN) 875-125 MG tablet Take 1 tablet by mouth every 12 (twelve) hours for 10 days. 07/16/17 07/26/17  Dalyah Pla A, PA-C  azithromycin (ZITHROMAX Z-PAK) 250 MG tablet Take two tabs po QD day 1 then 1 tab po QD days 2-5, then stop. 09/26/13   Presson, Mathis Fare, PA  azithromycin (ZITHROMAX) 250 MG tablet Take 1 tablet (250 mg total) by mouth daily. Take first 2 tablets together, then 1 every day until finished. 07/26/16   Deatra Canter, FNP  benzonatate (TESSALON) 100 MG capsule Take 2 capsules (200 mg total) by mouth 3 (three) times daily as needed for cough. 07/26/16   Deatra Canter, FNP  cetirizine (ZYRTEC) 10 MG tablet Take 10  mg by mouth as needed for allergies.    [provider]  fluticasone (FLONASE) 50 MCG/ACT nasal spray Place into both nostrils daily.    [provider]  HYDROcodone-acetaminophen (HYCET) 7.5-325 mg/15 ml solution Take 15 mLs by mouth every 8 (eight) hours as needed for moderate pain. 12/01/13   Antony Madura, PA-C  ibuprofen (ADVIL,MOTRIN) 200 MG tablet Take 400 mg by mouth every 6 (six) hours as needed. Pain    [provider]  naproxen (NAPROSYN) 500 MG tablet Take 1 tablet (500 mg total) by mouth 2 (two) times daily. 12/01/13   Antony Madura, PA-C  Omega-3 Fatty Acids (FISH OIL) 1000 MG CAPS Take 1 capsule by mouth daily.     [provider]  OVER THE COUNTER MEDICATION EAR drop    [provider]  predniSONE (DELTASONE) 20 MG tablet Take 3 tablets (60 mg total) by mouth daily. 12/12/15   Servando Salina, NP  predniSONE (STERAPRED UNI-PAK 21 TAB) 10 MG (21) TBPK tablet Take 6-5-4-3-2-1 07/26/16   Deatra Canter, FNP  promethazine-dextromethorphan (PROMETHAZINE-DM) 6.25-15 MG/5ML syrup Take 5 mLs by mouth 4 (four) times daily as needed for cough. 07/16/17   Eulamae Greenstein A, PA-C  sulfamethoxazole-trimethoprim (BACTRIM DS) 800-160 MG per tablet Take 1 tablet by mouth as needed (acne).    [provider]  traMADol (ULTRAM) 50 MG tablet Take 1 tablet (50 mg total) by mouth every 6 (six) hours as needed. 12/12/15   Servando Salina, NP    Family History Family History  Problem Relation Age of Onset  . Anesthesia problems Neg Hx     Social History Social History   Tobacco Use  . Smoking status: Never Smoker  . Smokeless tobacco: Never Used  Substance Use Topics  . Alcohol use: No  . Drug use: No     Allergies   Patient has no known allergies.   Review of Systems Review of Systems  Constitutional: Negative for activity change, chills and fever.  HENT: Positive for congestion, ear pain and postnasal drip. Negative for dental problem, ear discharge, facial swelling, rhinorrhea, sinus pressure, sinus pain, tinnitus and trouble swallowing.   Eyes: Negative for visual disturbance.  Respiratory: Positive for cough and shortness of breath.   Cardiovascular: Negative for chest pain.  Gastrointestinal: Negative for abdominal pain, diarrhea, nausea and vomiting.  Musculoskeletal: Negative for back pain.  Allergic/Immunologic: Negative for immunocompromised state.  Neurological: Negative for headaches.   Physical Exam Updated Vital Signs BP (!) 142/91 (BP Location: Left Arm)   Pulse 85   Temp 98.3 F (36.8 C) (Oral)   Resp 18   Ht 4\' 9"  (1.448 m)   Wt 106.1 kg (234 lb)    SpO2 93%   BMI 50.64 kg/m   Physical Exam  Constitutional: No distress.  HENT:  Head: Normocephalic.  Right Ear: External ear normal. Tympanic membrane is not erythematous. A middle ear effusion is present.  Left Ear: External ear normal.  Nose: Rhinorrhea present. No mucosal edema. Right sinus exhibits no maxillary sinus tenderness and no frontal sinus tenderness. Left sinus exhibits no maxillary sinus tenderness and no frontal sinus tenderness.  Mouth/Throat: Uvula is midline. Mucous membranes are pale. Posterior oropharyngeal erythema present. No tonsillar exudate.  Erythema noted in the left ear canal.  TM is bulging, and erythematous.  No rupture of the TM, but purulent material is present.   No pain with movement of the auricle or tragus bilaterally.  No tender to palpation  over the mastoid process bilaterally  Eyes: Conjunctivae are normal. No scleral icterus.  Neck: Normal range of motion. Neck supple.  No meningeal signs.   Cardiovascular: Normal rate, regular rhythm, normal heart sounds and intact distal pulses. Exam reveals no gallop and no friction rub.  No murmur heard. Pulmonary/Chest: Effort normal. No stridor. No respiratory distress. She has wheezes. She has no rales. She exhibits no tenderness.  Abdominal: Soft. She exhibits no distension. There is no tenderness.  Obese abdomen.  Neurological: She is alert.  Skin: Skin is warm. No rash noted. She is not diaphoretic.  Psychiatric: Her behavior is normal.  Nursing note and vitals reviewed.    ED Treatments / Results  Labs (all labs ordered are listed, but only abnormal results are displayed) Labs Reviewed - No data to display  EKG  EKG Interpretation None       Radiology Dg Chest 2 View  Result Date: 07/16/2017 CLINICAL DATA:  Cough EXAM: CHEST  2 VIEW COMPARISON:  11/10/2014 FINDINGS: Heart and mediastinal contours are within normal limits. No focal opacities or effusions. No acute bony abnormality.  IMPRESSION: No active cardiopulmonary disease. Electronically Signed   By: Charlett NoseKevin  Dover M.D.   On: 07/16/2017 13:34    Procedures Procedures (including critical care time)  Medications Ordered in ED Medications  ipratropium-albuterol (DUONEB) 0.5-2.5 (3) MG/3ML nebulizer solution 3 mL (3 mLs Nebulization Given 07/16/17 1258)  ketorolac (TORADOL) injection 60 mg (60 mg Intramuscular Given 07/16/17 1257)     Initial Impression / Assessment and Plan / ED Course  I have reviewed the triage vital signs and the nursing notes.  Pertinent labs & imaging results that were available during my care of the patient were reviewed by me and considered in my medical decision making (see chart for details).     [age] year old female with a history of asthma and DM presenting with productive cough, dyspnea, nasal congestion, and left otalgia.  She has a history of recurrent AOM.  Left TM on exam is bulging, erythematous, with purulent material presents no mastoid tenderness.  Lungs with diffuse expiratory wheezes in all fields.  SaO2 93 on room air on arrival.  DuoNeb given.  Wheezing is still present, but much improved.  Chest x-ray is unremarkable without opacities or infiltrates concerning for pneumonia. She is satting at 98% on room air and maintaining her sats with ambulation.  Doubt influenza, sinusitis, or meningitis.  She states that she feels much better and has no dyspnea at this time.  Will discharge the patient with a refill of her albuterol nebulizer, Promethazine DM, and Augmentin to treat recurrent AOM.  Discussed this plan with the patient who is agreeable at this time.  She is hemodynamically stable.  No acute distress.  The patient is safe for discharge at this time.  Final Clinical Impressions(s) / ED Diagnoses   Final diagnoses:  Mild intermittent asthma with exacerbation  Recurrent acute suppurative otitis media without spontaneous rupture of left tympanic membrane    ED Discharge Orders         Ordered    amoxicillin-clavulanate (AUGMENTIN) 875-125 MG tablet  Every 12 hours     07/16/17 1422    promethazine-dextromethorphan (PROMETHAZINE-DM) 6.25-15 MG/5ML syrup  4 times daily PRN     07/16/17 1422    albuterol (PROVENTIL) (2.5 MG/3ML) 0.083% nebulizer solution  Every 6 hours PRN     07/16/17 1422       Taesean Reth A, PA-C 07/16/17 1429  Rolland PorterJames, Mark, MD 07/18/17 2122

## 2017-07-16 NOTE — ED Notes (Signed)
Pt is alert and oriented x 4 and is verbally responsive, Pt reports that she awoke with Left ear pain 10/10 pain that is throbbing and radiates to left side of head.  Pt reports that she has had a productive cough x 2 weeks with white thick phlegm . Pt denies throat pain, pt denies n/v, and denies sinus pain

## 2017-07-16 NOTE — Discharge Instructions (Signed)
For the infection in your ear, take 1 tablet of Augmentin every 12 hours for the next 10 days.  This is an antibiotic.   For your wheezing and shortness of breath, you can take 1 albuterol nebulizer treatment no more than once every 4 hours for wheezing and shortness of breath.  You may also take 2 puffs of your inhaler.  Do not use both of these medications together.  Use only one or the other every 4 hours.  You may also take 5 mL of Promethazine DM every 6 hours as needed for cough and congestion.  Saline nasal rinse is available over-the-counter which may also help with nasal congestion or you can continue to use your home Flonase.  You can take 600 mg of ibuprofen with food every 6 hours as needed for inflammation and pain.  You were given a dose of a similar medication in the emergency department, so please do not take a dose until after 6 or 8 PM tonight.  Take this medication with food so it does not upset your stomach.  If you develop any new or worsening symptoms including fever, chills, chest pain, worsening shortness of breath, or other concerning symptoms, please return to the emergency department for re-evaluation.

## 2017-07-16 NOTE — ED Triage Notes (Signed)
Patient c/o cough, congestion, and left ear pain since this morning.

## 2017-07-18 ENCOUNTER — Encounter: Payer: Self-pay | Admitting: Emergency Medicine

## 2017-07-18 ENCOUNTER — Other Ambulatory Visit: Payer: Self-pay

## 2017-07-18 DIAGNOSIS — Z79899 Other long term (current) drug therapy: Secondary | ICD-10-CM | POA: Insufficient documentation

## 2017-07-18 DIAGNOSIS — H66015 Acute suppurative otitis media with spontaneous rupture of ear drum, recurrent, left ear: Secondary | ICD-10-CM | POA: Insufficient documentation

## 2017-07-18 NOTE — ED Triage Notes (Addendum)
Patient ambulatory to triage with steady gait, without difficulty or distress noted, mask in place; pt [facility]; st "I just want to see if I can a shot of what they gave me up there so I can go to sleep"; review of chart indicates pt received toradol injection

## 2017-07-19 ENCOUNTER — Emergency Department
Admission: EM | Admit: 2017-07-19 | Discharge: 2017-07-19 | Disposition: A | Discharge status: Home / Self Care | Payer: Self-pay | Attending: Emergency Medicine | Admitting: Emergency Medicine

## 2017-07-19 DIAGNOSIS — H66015 Acute suppurative otitis media with spontaneous rupture of ear drum, recurrent, left ear: Secondary | ICD-10-CM

## 2017-07-19 DIAGNOSIS — H9202 Otalgia, left ear: Secondary | ICD-10-CM

## 2017-07-19 MED ORDER — CIPROFLOXACIN-DEXAMETHASONE 0.3-0.1 % OT SUSP
4.0000 [drp] | Freq: Once | OTIC | Status: AC
  Administered 2017-07-19: 4 [drp] via OTIC
  Filled 2017-07-19: qty 7.5

## 2017-07-19 MED ORDER — PROMETHAZINE-DM 6.25-15 MG/5ML PO SYRP: 5.0000 mL | ORAL_SOLUTION | Freq: Four times a day (QID) | ORAL | 0 refills | Status: DC | PRN

## 2017-07-19 MED ORDER — KETOROLAC TROMETHAMINE 30 MG/ML IJ SOLN
60.0000 mg | Freq: Once | INTRAMUSCULAR | Status: AC
  Administered 2017-07-19: 60 mg via INTRAMUSCULAR
  Filled 2017-07-19: qty 2

## 2017-07-19 NOTE — Discharge Instructions (Signed)
1.  Continue and finish antibiotic as previously prescribed. 2.  Apply Ciprodex eardrops 4 drops to left ear twice daily for 7 days. 3.  Keep the ear dry while bathing or showering. 4.  Your cough medicine has been refilled. 5.  Return to the ER for worsening symptoms, persistent vomiting, fever or other concerns.

## 2017-07-19 NOTE — ED Provider Notes (Signed)
[facility] Emergency Department Provider Note   ____________________________________________   First MD Initiated Contact with Patient 07/19/17 [phone]     (approximate)  I have reviewed the triage vital signs and the nursing notes.   HISTORY  Chief Complaint Otalgia    HPI Bing Neighborsatricia A Quarry is a 46 y.o. female who presents to the ED from home with a chief complaint of left ear pain and drainage.  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  [facility] for left otalgia, prescribed Augmentin for otitis media.  States she is taking the antibiotic as directed.  Noted discharge today.  Denies barotrauma or swimming.  Denies associated fever, chills, headache, dizziness, chest pain, shortness of breath, abdominal pain, nausea or vomiting.  Requesting shot of Toradol as that her discomfort tremendously on her prior ED visit.     Past Medical History:  Diagnosis Date  . Acne    on bactrim  . Anxiety     There are no active problems to display for this patient.   Past Surgical History:  Procedure Laterality Date  . TUBAL LIGATION      Prior to Admission medications   Medication Sig Start Date End Date Taking? Authorizing Provider  albuterol (PROVENTIL) (2.5 MG/3ML) 0.083% nebulizer solution Take 3 mLs (2.5 mg total) by nebulization every 6 (six) hours as needed for wheezing or shortness of breath. 07/26/16   Deatra Canterxford, William J, FNP  albuterol (PROVENTIL) (2.5 MG/3ML) 0.083% nebulizer solution Take 3 mLs (2.5 mg total) by nebulization every 6 (six) hours as needed for wheezing or shortness of breath. 07/16/17   McDonald, Mia A, PA-C  ALPRAZolam (XANAX) 0.5 MG tablet Take 0.5 mg by mouth daily as needed. Takes for anxiety    [provider]  amoxicillin-clavulanate (AUGMENTIN) 875-125 MG tablet Take 1 tablet by mouth every 12 (twelve) hours for 10 days. 07/16/17 07/26/17   McDonald, Mia A, PA-C  azithromycin (ZITHROMAX Z-PAK) 250 MG tablet Take two tabs po QD day 1 then 1 tab po QD days 2-5, then stop. 09/26/13   Presson, Mathis FareJennifer Lee H, PA  azithromycin (ZITHROMAX) 250 MG tablet Take 1 tablet (250 mg total) by mouth daily. Take first 2 tablets together, then 1 every day until finished. 07/26/16   Deatra Canterxford, William J, FNP  benzonatate (TESSALON) 100 MG capsule Take 2 capsules (200 mg total) by mouth 3 (three) times daily as needed for cough. 07/26/16   Deatra Canterxford, William J, FNP  cetirizine (ZYRTEC) 10 MG tablet Take 10 mg by mouth as needed for allergies.    [provider]  fluticasone (FLONASE) 50 MCG/ACT nasal spray Place into both nostrils daily.    [provider]  HYDROcodone-acetaminophen (HYCET) 7.5-325 mg/15 ml solution Take 15 mLs by mouth every 8 (eight) hours as needed for moderate pain. 12/01/13   Antony MaduraHumes, Kelly, PA-C  ibuprofen (ADVIL,MOTRIN) 200 MG tablet Take 400 mg by mouth every 6 (six) hours as needed. Pain    [provider]  naproxen (NAPROSYN) 500 MG tablet Take 1 tablet (500 mg total) by mouth 2 (two) times daily. 12/01/13   Antony MaduraHumes, Kelly, PA-C  Omega-3 Fatty Acids (FISH OIL) 1000 MG CAPS Take 1 capsule by mouth daily.    [provider]  OVER THE COUNTER MEDICATION EAR drop    [provider]  predniSONE (DELTASONE) 20 MG tablet Take 3 tablets (60 mg total)  by mouth daily. 12/12/15   Servando Salinaossi, Catherine H, NP  predniSONE (STERAPRED UNI-PAK 21 TAB) 10 MG (21) TBPK tablet Take 6-5-4-3-2-1 07/26/16   Deatra Canterxford, William J, FNP  promethazine-dextromethorphan (PROMETHAZINE-DM) 6.25-15 MG/5ML syrup Take 5 mLs by mouth 4 (four) times daily as needed for cough. 07/19/17   Irean HongSung, Jade J, MD  sulfamethoxazole-trimethoprim (BACTRIM DS) 800-160 MG per tablet Take 1 tablet by mouth as needed (acne).    [provider]  traMADol (ULTRAM) 50 MG tablet Take 1 tablet (50 mg total) by mouth every 6 (six) hours as needed. 12/12/15   Servando Salinaossi,  Catherine H, NP    Allergies Patient has no known allergies.  Family History  Problem Relation Age of Onset  . Anesthesia problems Neg Hx     Social History Social History   Tobacco Use  . Smoking status: Never Smoker  . Smokeless tobacco: Never Used  Substance Use Topics  . Alcohol use: No  . Drug use: No    Review of Systems  Constitutional: No fever/chills. Eyes: No visual changes. ENT: Positive for left ear pain and drainage.  No sore throat. Cardiovascular: Denies chest pain. Respiratory: Denies shortness of breath. Gastrointestinal: No abdominal pain.  No nausea, no vomiting.  No diarrhea.  No constipation. Genitourinary: Negative for dysuria. Musculoskeletal: Negative for back pain. Skin: Negative for rash. Neurological: Negative for headaches, focal weakness or numbness.   ____________________________________________   PHYSICAL EXAM:  VITAL SIGNS: ED Triage Vitals [07/18/17 2355]  Enc Vitals Group     BP (!) 150/92     Pulse Rate 94     Resp 18     Temp 98.3 F (36.8 C)     Temp Source Oral     SpO2 97 %     Weight 235 lb (106.6 kg)     Height 4\' 9"  (1.448 m)     Head Circumference      Peak Flow      Pain Score 10     Pain Loc      Pain Edu?      Excl. in GC?     Constitutional: Alert and oriented. Well appearing and in no acute distress. Eyes: Conjunctivae are normal. PERRL. EOMI. Head: Atraumatic. Ears: Right TM within normal limits. Left ear: No pain on movement of auricle.  Purulent debris in ear canal.  Scar tissue noted on tympanic membrane which is difficult to visualize. Nose: No congestion/rhinnorhea. Mouth/Throat: Mucous membranes are moist.  Oropharynx non-erythematous. Neck: No stridor.   Cardiovascular: Normal rate, regular rhythm. Grossly normal heart sounds.  Good peripheral circulation. Respiratory: Normal respiratory effort.  No retractions. Lungs CTAB. Gastrointestinal: Soft and nontender. No distention. No abdominal  bruits. No CVA tenderness. Musculoskeletal: No lower extremity tenderness nor edema.  No joint effusions. Neurologic:  Normal speech and language. No gross focal neurologic deficits are appreciated. No gait instability. Skin:  Skin is warm, dry and intact. No rash noted. Psychiatric: Mood and affect are normal. Speech and behavior are normal.  ____________________________________________   LABS (all labs ordered are listed, but only abnormal results are displayed)  Labs Reviewed - No data to display ____________________________________________  EKG  None ____________________________________________  RADIOLOGY  No results found.  ____________________________________________   PROCEDURES  Procedure(s) performed: None  Procedures  Critical Care performed: No  ____________________________________________   INITIAL IMPRESSION / ASSESSMENT AND PLAN / ED COURSE  As part of my medical decision making, I reviewed the following data within the electronic MEDICAL RECORD NUMBER  Old chart reviewed and Notes from prior ED visits.   46 year old female with a history of recurrent ear infections currently on Augmentin for left otitis media.  There is purulent drainage in the left ear canal which somewhat obscures the tympanic membrane.  In addition there is scar tissue which makes the TM difficult to visualize.  Presumably TM may have had spontaneous rupture secondary to infection.  Will administer IM Toradol, cover patient with Ciprodex eardrops in addition to the Augmentin she is already taking.  Encouraged her to keep the ear dry while bathing or showering, and she will follow-up with her doctor this week.  Patient also requesting refill of her cough medicine.  Strict return precautions given.  Patient verbalizes understanding and agrees with plan of care.      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    Irean Hong, MD 07/19/17 [phone]

## 2017-08-07 ENCOUNTER — Ambulatory Visit: Payer: Self-pay

## 2017-08-09 ENCOUNTER — Ambulatory Visit
Admission: RE | Admit: 2017-08-09 | Discharge: 2017-08-09 | Disposition: A | Discharge status: Home / Self Care | Payer: Self-pay | Source: Ambulatory Visit | Attending: Family Medicine | Admitting: Family Medicine

## 2017-08-09 DIAGNOSIS — Z1231 Encounter for screening mammogram for malignant neoplasm of breast: Secondary | ICD-10-CM

## 2017-08-14 ENCOUNTER — Other Ambulatory Visit: Payer: Self-pay | Admitting: Family Medicine

## 2017-08-14 ENCOUNTER — Other Ambulatory Visit (HOSPITAL_COMMUNITY)
Admission: RE | Admit: 2017-08-14 | Discharge: 2017-08-14 | Disposition: A | Discharge status: Home / Self Care | Payer: No Typology Code available for payment source | Source: Ambulatory Visit | Attending: Family Medicine | Admitting: Family Medicine

## 2017-08-14 DIAGNOSIS — Z124 Encounter for screening for malignant neoplasm of cervix: Secondary | ICD-10-CM | POA: Insufficient documentation

## 2017-08-16 LAB — CYTOLOGY - PAP: Diagnosis: NEGATIVE

## 2017-12-28 ENCOUNTER — Ambulatory Visit (INDEPENDENT_AMBULATORY_CARE_PROVIDER_SITE_OTHER): Payer: No Typology Code available for payment source | Admitting: Physician Assistant

## 2018-01-15 ENCOUNTER — Ambulatory Visit (INDEPENDENT_AMBULATORY_CARE_PROVIDER_SITE_OTHER): Payer: Self-pay

## 2018-01-15 ENCOUNTER — Ambulatory Visit (HOSPITAL_COMMUNITY)
Admission: EM | Admit: 2018-01-15 | Discharge: 2018-01-15 | Disposition: A | Discharge status: Home / Self Care | Payer: Self-pay | Attending: Family Medicine | Admitting: Family Medicine

## 2018-01-15 ENCOUNTER — Encounter (HOSPITAL_COMMUNITY): Payer: Self-pay | Admitting: Emergency Medicine

## 2018-01-15 DIAGNOSIS — J209 Acute bronchitis, unspecified: Secondary | ICD-10-CM

## 2018-01-15 DIAGNOSIS — R05 Cough: Secondary | ICD-10-CM

## 2018-01-15 DIAGNOSIS — R0602 Shortness of breath: Secondary | ICD-10-CM

## 2018-01-15 MED ORDER — ALBUTEROL SULFATE HFA 108 (90 BASE) MCG/ACT IN AERS
INHALATION_SPRAY | RESPIRATORY_TRACT | Status: AC
  Filled 2018-01-15: qty 6.7

## 2018-01-15 MED ORDER — IPRATROPIUM-ALBUTEROL 0.5-2.5 (3) MG/3ML IN SOLN
RESPIRATORY_TRACT | Status: AC
  Filled 2018-01-15: qty 3

## 2018-01-15 MED ORDER — IPRATROPIUM-ALBUTEROL 0.5-2.5 (3) MG/3ML IN SOLN
3.0000 mL | Freq: Once | RESPIRATORY_TRACT | Status: AC
  Administered 2018-01-15: 3 mL via RESPIRATORY_TRACT

## 2018-01-15 MED ORDER — BENZONATATE 100 MG PO CAPS: 100.0000 mg | ORAL_CAPSULE | Freq: Three times a day (TID) | ORAL | 0 refills | Status: DC | PRN

## 2018-01-15 MED ORDER — ALBUTEROL SULFATE HFA 108 (90 BASE) MCG/ACT IN AERS
2.0000 | INHALATION_SPRAY | Freq: Once | RESPIRATORY_TRACT | Status: AC
  Administered 2018-01-15: 2 via RESPIRATORY_TRACT

## 2018-01-15 MED ORDER — AZITHROMYCIN 250 MG PO TABS: 250.0000 mg | ORAL_TABLET | Freq: Every day | ORAL | 0 refills | Status: DC

## 2018-01-15 NOTE — ED Provider Notes (Signed)
[facility]   [id number] 01/15/18 Arrival Time: 1859  SUBJECTIVE:  Bing Neighborsatricia A Hope is a 47 y.o. female hx of asthma who presents with cough and SOB for 3 weeks.  Denies positive sick exposure or precipitating event.  Describes cough as intermittent and productive with clear sputum.  Has tried breathing treatment and symbicort with temporary relief.  Denies aggravating factors.  Reports previous symptoms in the past.   Complains of otalgia, rhinorrhea, and wheezing.   Denies fever, chills, fatigue, sinus pain, sore throat, chest pain, nausea, changes in bowel or bladder habits.    ROS: As per HPI.  Past Medical History:  Diagnosis Date  . Acne    on bactrim  . Anxiety    Past Surgical History:  Procedure Laterality Date  . TUBAL LIGATION     No Known Allergies No current facility-administered medications on file prior to encounter.    Current Outpatient Medications on File Prior to Encounter  Medication Sig Dispense Refill  . ALPRAZolam (XANAX) 0.5 MG tablet Take 0.5 mg by mouth daily as needed. Takes for anxiety    . predniSONE (STERAPRED UNI-PAK 21 TAB) 10 MG (21) TBPK tablet Take 6-5-4-3-2-1 21 tablet 0  . promethazine-dextromethorphan (PROMETHAZINE-DM) 6.25-15 MG/5ML syrup Take 5 mLs by mouth 4 (four) times daily as needed for cough. 118 mL 0  . albuterol (PROVENTIL) (2.5 MG/3ML) 0.083% nebulizer solution Take 3 mLs (2.5 mg total) by nebulization every 6 (six) hours as needed for wheezing or shortness of breath. 75 mL 0  . albuterol (PROVENTIL) (2.5 MG/3ML) 0.083% nebulizer solution Take 3 mLs (2.5 mg total) by nebulization every 6 (six) hours as needed for wheezing or shortness of breath. 75 mL 1  . cetirizine (ZYRTEC) 10 MG tablet Take 10 mg by mouth as needed for allergies.    . fluticasone (FLONASE) 50 MCG/ACT nasal spray Place into both nostrils daily.    Marland Kitchen. HYDROcodone-acetaminophen (HYCET) 7.5-325 mg/15 ml solution Take 15 mLs by mouth every 8 (eight) hours as  needed for moderate pain. 120 mL 0  . ibuprofen (ADVIL,MOTRIN) 200 MG tablet Take 400 mg by mouth every 6 (six) hours as needed. Pain    . naproxen (NAPROSYN) 500 MG tablet Take 1 tablet (500 mg total) by mouth 2 (two) times daily. 30 tablet 0  . Omega-3 Fatty Acids (FISH OIL) 1000 MG CAPS Take 1 capsule by mouth daily.    Marland Kitchen. OVER THE COUNTER MEDICATION EAR drop    . predniSONE (DELTASONE) 20 MG tablet Take 3 tablets (60 mg total) by mouth daily. 15 tablet 0  . sulfamethoxazole-trimethoprim (BACTRIM DS) 800-160 MG per tablet Take 1 tablet by mouth as needed (acne).    . traMADol (ULTRAM) 50 MG tablet Take 1 tablet (50 mg total) by mouth every 6 (six) hours as needed. 15 tablet 0    Social History   Socioeconomic History  . Marital status: Married    Spouse name: Not on file  . Number of children: Not on file  . Years of education: Not on file  . Highest education level: Not on file  Occupational History  . Not on file  Social Needs  . Financial resource strain: Not on file  . Food insecurity:    Worry: Not on file    Inability: Not on file  . Transportation needs:    Medical: Not on file    Non-medical: Not on file  Tobacco Use  . Smoking status: Never Smoker  . Smokeless tobacco:  Never Used  Substance and Sexual Activity  . Alcohol use: No  . Drug use: No  . Sexual activity: Yes    Birth control/protection: Surgical  Lifestyle  . Physical activity:    Days per week: Not on file    Minutes per session: Not on file  . Stress: Not on file  Relationships  . Social connections:    Talks on phone: Not on file    Gets together: Not on file    Attends religious service: Not on file    Active member of club or organization: Not on file    Attends meetings of clubs or organizations: Not on file    Relationship status: Not on file  . Intimate partner violence:    Fear of current or ex partner: Not on file    Emotionally abused: Not on file    Physically abused: Not on file     Forced sexual activity: Not on file  Other Topics Concern  . Not on file  Social History Narrative  . Not on file   Family History  Problem Relation Age of Onset  . Anesthesia problems Neg Hx      OBJECTIVE:  Vitals:   01/15/18 1920  BP: (!) 124/92  Pulse: 95  Resp: 16  Temp: 98.8 F (37.1 C)  TempSrc: Oral  SpO2: 95%  Weight: 215 lb (97.5 kg)     General appearance: AOx3 in no acute distress; speaking in full sentences HEENT: EACs clear, TM pearly gray with visible cone of light; PERRL.  EOM grossly intact.  Sinuses nontender; no rhinorrhea; tonsils nonerythematous, uvula midline Neck: supple without LAD Lungs: diffuse wheezes head throughout bilateral lung fields Heart: regular rate and rhythm.  Radial pulses 2+ symmetrical bilaterally Skin: warm and dry Psychological: alert and cooperative; normal mood and affect  DIAGNOSTIC STUDIES:   CLINICAL DATA: Cough and dyspnea  EXAM: CHEST - 2 VIEW  COMPARISON: 07/16/2017 chest radiograph.  FINDINGS: Low lung volumes. Stable cardiomediastinal silhouette with normal heart size. No pneumothorax. No pleural effusion. Lungs appear clear, with no acute consolidative airspace disease and no pulmonary edema.  IMPRESSION: Low lung volumes. No active cardiopulmonary disease.   Electronically Signed By: Delbert Phenix M.D. On: 01/15/2018 20:28  I have reviewed the x-rays myself and I am in agreement with the radiologist interpretation.    ASSESSMENT & PLAN:  1. Acute bronchitis, unspecified organism     Meds ordered this encounter  Medications  . albuterol (PROVENTIL HFA;VENTOLIN HFA) 108 (90 Base) MCG/ACT inhaler 2 puff  . ipratropium-albuterol (DUONEB) 0.5-2.5 (3) MG/3ML nebulizer solution 3 mL  . azithromycin (ZITHROMAX) 250 MG tablet    Sig: Take 1 tablet (250 mg total) by mouth daily. Take first 2 tablets together, then 1 every day until finished.    Dispense:  6 tablet    Refill:  0    Order Specific  Question:   Supervising Provider    Answer:   Isa Rankin [phone]  . benzonatate (TESSALON) 100 MG capsule    Sig: Take 1 capsule (100 mg total) by mouth every 8 (eight) hours as needed for cough.    Dispense:  21 capsule    Refill:  0    Order Specific Question:   Supervising Provider    Answer:   Isa Rankin [[id number]]   Duo-neb given in office Albuterol inhaler given in office Get plenty of rest and push fluids z-pak given  Use OTC medication as needed for  symptomatic relief Prescribed tessolone perles as needed for cough Follow up with PCP if symptoms persist Return or go to ER if you have any new or worsening symptoms   Reviewed expectations re: course of current medical issues. Questions answered. Outlined signs and symptoms indicating need for more acute intervention. Patient verbalized understanding. After Visit Summary given.          Rennis Harding, PA-C 01/15/18 2050

## 2018-01-15 NOTE — Discharge Instructions (Addendum)
Duo-neb given in office Albuterol inhaler given in office Get plenty of rest and push fluids Use OTC medication as needed for symptomatic relief Prescribed tessolone perles as needed for cough Follow up with PCP if symptoms persist Return or go to ER if you have any new or worsening symptoms

## 2018-01-15 NOTE — ED Triage Notes (Signed)
PT reports his asthma. PT reports cough, SOB, ear pain for 3 weeks. PT has taken doxy and prednisone since onset. PT states," I feel like I'm going to fall out because I can't breathe well." PT is speaking in full sentences and walked back with no difficulty.

## 2018-01-23 ENCOUNTER — Ambulatory Visit (INDEPENDENT_AMBULATORY_CARE_PROVIDER_SITE_OTHER): Payer: No Typology Code available for payment source | Admitting: Physician Assistant

## 2018-01-31 ENCOUNTER — Telehealth: Payer: Self-pay | Admitting: Allergy and Immunology

## 2018-01-31 NOTE — Telephone Encounter (Signed)
Patient was last seen in 2016. She would like to know what she was allergic to when she was tested. Her chart is scanned in the S drive.

## 2018-01-31 NOTE — Telephone Encounter (Signed)
Called and spoke with patient and informed her Kathryn Moon was not allergic to anything when Kathryn Moon was tested. Patient stated Kathryn Moon is getting her insurance fix so Kathryn Moon can come in to see Dr. Lucie LeatherKozlow about her Asthma.

## 2018-03-02 DIAGNOSIS — Z79899 Other long term (current) drug therapy: Secondary | ICD-10-CM | POA: Insufficient documentation

## 2018-03-02 DIAGNOSIS — R05 Cough: Secondary | ICD-10-CM | POA: Insufficient documentation

## 2018-03-02 DIAGNOSIS — J45909 Unspecified asthma, uncomplicated: Secondary | ICD-10-CM | POA: Insufficient documentation

## 2018-03-02 DIAGNOSIS — J309 Allergic rhinitis, unspecified: Secondary | ICD-10-CM | POA: Insufficient documentation

## 2018-03-02 DIAGNOSIS — R0982 Postnasal drip: Secondary | ICD-10-CM | POA: Insufficient documentation

## 2018-03-02 NOTE — ED Triage Notes (Signed)
Patient presents with a cough that she has had since December. Patient hx of asthma. Patient seen at urgent care and by asthma doctor. Asthma doctor told patient she, "needs a double dose of z-pak and prednisone." Patient also has multiple complaints. Patient also complaining of a possible yeast infection.

## 2018-03-03 ENCOUNTER — Emergency Department (HOSPITAL_COMMUNITY)
Admission: EM | Admit: 2018-03-03 | Discharge: 2018-03-03 | Disposition: A | Discharge status: Home / Self Care | Payer: No Typology Code available for payment source | Attending: Emergency Medicine | Admitting: Emergency Medicine

## 2018-03-03 ENCOUNTER — Encounter (HOSPITAL_COMMUNITY): Payer: Self-pay | Admitting: Emergency Medicine

## 2018-03-03 ENCOUNTER — Other Ambulatory Visit: Payer: Self-pay

## 2018-03-03 DIAGNOSIS — R0982 Postnasal drip: Secondary | ICD-10-CM

## 2018-03-03 DIAGNOSIS — R059 Cough, unspecified: Secondary | ICD-10-CM

## 2018-03-03 DIAGNOSIS — J309 Allergic rhinitis, unspecified: Secondary | ICD-10-CM

## 2018-03-03 DIAGNOSIS — R05 Cough: Secondary | ICD-10-CM

## 2018-03-03 HISTORY — DX: Unspecified asthma, uncomplicated: J45.909

## 2018-03-03 HISTORY — DX: Fibromyalgia: M79.7

## 2018-03-03 MED ORDER — BENZONATATE 100 MG PO CAPS: 100.0000 mg | ORAL_CAPSULE | Freq: Three times a day (TID) | ORAL | 0 refills | Status: DC | PRN

## 2018-03-03 NOTE — ED Notes (Signed)
ED Provider at bedside. 

## 2018-03-03 NOTE — ED Provider Notes (Signed)
[facility]-EMERGENCY DEPT Provider Note  CSN: [id number] Arrival date & time: 03/02/18 2209  Chief Complaint(s) Cough  HPI Kathryn Moon is a 47 y.o. female   The history is provided by the patient.  Cough  This is a chronic problem. Episode onset: several months. The problem occurs every few hours. The problem has not changed since onset.The cough is non-productive. There has been no fever. Associated symptoms include shortness of breath. Pertinent negatives include no chest pain, no chills, no headaches, no myalgias and no wheezing. Treatments tried: inhalers, Abx, steroids. Improvement on treatment: improved while on it, but returns after. She is not a smoker. Her past medical history is significant for bronchitis and asthma.    Past Medical History Past Medical History:  Diagnosis Date  . Acne    on bactrim  . Anxiety   . Asthma   . Fibromyalgia    There are no active problems to display for this patient.  Home Medication(s) Prior to Admission medications   Medication Sig Start Date End Date Taking? Authorizing Provider  albuterol (PROVENTIL) (2.5 MG/3ML) 0.083% nebulizer solution Take 3 mLs (2.5 mg total) by nebulization every 6 (six) hours as needed for wheezing or shortness of breath. 07/16/17  Yes McDonald, Mia A, PA-C  ALPRAZolam (XANAX) 0.5 MG tablet Take 0.5 mg by mouth daily as needed for anxiety or sleep. Takes for anxiety    Yes [provider]  ciprofloxacin-dexamethasone (CIPRODEX) OTIC suspension Place 4 drops into both ears 2 (two) times daily.   Yes [provider]  fluticasone (FLONASE) 50 MCG/ACT nasal spray Place into both nostrils daily.   Yes [provider]  ibuprofen (ADVIL,MOTRIN) 200 MG tablet Take 200 mg by mouth every 6 (six) hours as needed for moderate pain. Pain     Yes [provider]  latanoprost (XALATAN) 0.005 % ophthalmic solution Place 1 drop into both eyes at bedtime.   Yes  [provider]  loratadine (CLARITIN) 10 MG tablet Take 10 mg by mouth daily.   Yes [provider]  Omega-3 Fatty Acids (FISH OIL) 1000 MG CAPS Take 1 capsule by mouth daily.   Yes [provider]  promethazine-dextromethorphan (PROMETHAZINE-DM) 6.25-15 MG/5ML syrup Take 5 mLs by mouth 4 (four) times daily as needed for cough. 07/19/17  Yes Irean Hong, MD  albuterol (PROVENTIL) (2.5 MG/3ML) 0.083% nebulizer solution Take 3 mLs (2.5 mg total) by nebulization every 6 (six) hours as needed for wheezing or shortness of breath. Patient not taking: Reported on 03/03/2018 07/26/16   Deatra Canter, FNP  azithromycin (ZITHROMAX) 250 MG tablet Take 1 tablet (250 mg total) by mouth daily. Take first 2 tablets together, then 1 every day until finished. Patient not taking: Reported on 03/03/2018 01/15/18   Wurst, [place], PA-C  benzonatate (TESSALON) 100 MG capsule Take 1 capsule (100 mg total) by mouth every 8 (eight) hours as needed for cough. 03/03/18   Nira Conn, MD  HYDROcodone-acetaminophen (HYCET) 7.5-325 mg/15 ml solution Take 15 mLs by mouth every 8 (eight) hours as needed for moderate pain. Patient not taking: Reported on 03/03/2018 12/01/13   Antony Madura, PA-C  naproxen (NAPROSYN) 500 MG tablet Take 1 tablet (500 mg total) by mouth 2 (two) times daily. Patient not taking: Reported on 03/03/2018 12/01/13   Antony Madura, PA-C  predniSONE (DELTASONE) 20 MG tablet Take 3 tablets (60 mg total) by mouth daily. Patient not taking: Reported on 03/03/2018 12/12/15   Weber Cooks  H, NP  predniSONE (STERAPRED UNI-PAK 21 TAB) 10 MG (21) TBPK tablet Take 6-5-4-3-2-1 Patient not taking: Reported on 03/03/2018 07/26/16   Deatra Canter, FNP  traMADol (ULTRAM) 50 MG tablet Take 1 tablet (50 mg total) by mouth every 6 (six) hours as needed. Patient not taking: Reported on 03/03/2018 12/12/15   Servando Salina, NP                                                                                                                                     Past Surgical History Past Surgical History:  Procedure Laterality Date  . TUBAL LIGATION     Family History Family History  Problem Relation Age of Onset  . Anesthesia problems Neg Hx     Social History Social History   Tobacco Use  . Smoking status: Never Smoker  . Smokeless tobacco: Never Used  Substance Use Topics  . Alcohol use: No  . Drug use: No   Allergies Patient has no known allergies.  Review of Systems Review of Systems  Constitutional: Negative for chills.  Respiratory: Positive for cough and shortness of breath. Negative for wheezing.   Cardiovascular: Negative for chest pain.  Musculoskeletal: Negative for myalgias.  Neurological: Negative for headaches.   All other systems are reviewed and are negative for acute change except as noted in the HPI  Physical Exam Vital Signs  I have reviewed the triage vital signs BP (!) 141/91 (BP Location: Left Arm)   Pulse 97   Temp 98.8 F (37.1 C) (Oral)   Resp 20   Ht 5\' 1"  (1.549 m)   Wt 112.9 kg   SpO2 97%   BMI 47.01 kg/m   Physical Exam  Constitutional: She is oriented to person, place, and time. She appears well-developed and well-nourished. No distress.  HENT:  Head: Normocephalic and atraumatic.  Nose: Mucosal edema and rhinorrhea present.  Mouth/Throat: No oropharyngeal exudate, posterior oropharyngeal edema or posterior oropharyngeal erythema. No tonsillar exudate.  Eyes: Pupils are equal, round, and reactive to light. Conjunctivae and EOM are normal. Right eye exhibits no discharge. Left eye exhibits no discharge. No scleral icterus.  Neck: Normal range of motion. Neck supple.  Cardiovascular: Normal rate and regular rhythm. Exam reveals no gallop and no friction rub.  No murmur heard. Pulmonary/Chest: Effort normal and breath sounds normal. No stridor. No respiratory distress. She has no wheezes. She has no rhonchi. She has no  rales.  Abdominal: Soft. She exhibits no distension. There is no tenderness.  Musculoskeletal: She exhibits no edema or tenderness.  Neurological: She is alert and oriented to person, place, and time.  Skin: Skin is warm and dry. No rash noted. She is not diaphoretic. No erythema.  Psychiatric: She has a normal mood and affect.  Vitals reviewed.   ED Results and Treatments Labs (all labs ordered are listed, but only abnormal results are displayed) Labs Reviewed -  No data to display                                                                                                                       EKG  EKG Interpretation  Date/Time:    Ventricular Rate:    PR Interval:    QRS Duration:   QT Interval:    QTC Calculation:   R Axis:     Text Interpretation:        Radiology No results found. Pertinent labs & imaging results that were available during my care of the patient were reviewed by me and considered in my medical decision making (see chart for details).  Medications Ordered in ED Medications - No data to display                                                                                                                                  Procedures Procedures  (including critical care time)  Medical Decision Making / ED Course I have reviewed the nursing notes for this encounter and the patient's prior records (if available in EHR or on provided paperwork).    Patient in no respiratory distress speaking in full sentences.  Lungs clear to auscultation bilaterally.  No evidence of bronchitis or rales concerning for pneumonia. Offered CXR, but she declined. Patient does have evidence of rhinitis and postnasal drip likely allergy related.  The patient appears reasonably screened and/or stabilized for discharge and I doubt any other medical condition or other [facility] requiring further screening, evaluation, or treatment in the ED at this time prior to discharge.  The  patient is safe for discharge with strict return precautions.   Final Clinical Impression(s) / ED Diagnoses Final diagnoses:  Cough  Allergic rhinitis with postnasal drip   Disposition: Discharge  Condition: Good  I have discussed the results, Dx and Tx plan with the patient who expressed understanding and agree(s) with the plan. Discharge instructions discussed at great length. The patient was given strict return precautions who verbalized understanding of the instructions. No further questions at time of discharge.    ED Discharge Orders         Ordered    benzonatate (TESSALON) 100 MG capsule  Every 8 hours PRN     03/03/18 0303           Follow Up: Clovis Riley, L.August Saucer, MD [address] [place] [address]  [phone]  Schedule an appointment as soon as possible for a visit  As needed      This chart was dictated using voice recognition software.  Despite best efforts to proofread,  errors can occur which can change the documentation meaning.   Nira Connardama, Pedro Eduardo, MD 03/03/18 [phone]

## 2018-03-17 ENCOUNTER — Emergency Department (HOSPITAL_COMMUNITY): Payer: Medicaid Other

## 2018-03-17 ENCOUNTER — Encounter (HOSPITAL_COMMUNITY): Payer: Self-pay | Admitting: Emergency Medicine

## 2018-03-17 ENCOUNTER — Emergency Department (HOSPITAL_COMMUNITY)
Admission: EM | Admit: 2018-03-17 | Discharge: 2018-03-17 | Disposition: A | Discharge status: Home / Self Care | Payer: Medicaid Other | Attending: Emergency Medicine | Admitting: Emergency Medicine

## 2018-03-17 ENCOUNTER — Other Ambulatory Visit: Payer: Self-pay

## 2018-03-17 DIAGNOSIS — J209 Acute bronchitis, unspecified: Secondary | ICD-10-CM

## 2018-03-17 DIAGNOSIS — M542 Cervicalgia: Secondary | ICD-10-CM

## 2018-03-17 DIAGNOSIS — Z79899 Other long term (current) drug therapy: Secondary | ICD-10-CM | POA: Insufficient documentation

## 2018-03-17 DIAGNOSIS — J45909 Unspecified asthma, uncomplicated: Secondary | ICD-10-CM | POA: Insufficient documentation

## 2018-03-17 LAB — CBG MONITORING, ED: Glucose-Capillary: 354 mg/dL — ABNORMAL HIGH (ref 70–99)

## 2018-03-17 MED ORDER — PREDNISONE 20 MG PO TABS
60.0000 mg | ORAL_TABLET | Freq: Once | ORAL | Status: AC
  Administered 2018-03-17: 60 mg via ORAL
  Filled 2018-03-17: qty 3

## 2018-03-17 MED ORDER — PREDNISONE 10 MG PO TABS: 40.0000 mg | ORAL_TABLET | Freq: Every day | ORAL | 0 refills | Status: DC

## 2018-03-17 MED ORDER — IPRATROPIUM BROMIDE 0.02 % IN SOLN
0.5000 mg | Freq: Once | RESPIRATORY_TRACT | Status: AC
  Administered 2018-03-17: 0.5 mg via RESPIRATORY_TRACT
  Filled 2018-03-17: qty 2.5

## 2018-03-17 MED ORDER — DEXAMETHASONE SODIUM PHOSPHATE 10 MG/ML IJ SOLN: 10.0000 mg | Freq: Once | INTRAMUSCULAR | Status: DC

## 2018-03-17 MED ORDER — ALBUTEROL (5 MG/ML) CONTINUOUS INHALATION SOLN
10.0000 mg/h | INHALATION_SOLUTION | Freq: Once | RESPIRATORY_TRACT | Status: AC
Start: 2018-03-17 — End: 2018-03-17
  Administered 2018-03-17: 10 mg/h via RESPIRATORY_TRACT
  Filled 2018-03-17: qty 20

## 2018-03-17 NOTE — ED Triage Notes (Signed)
Pt arriving with cough and severe headache. Pt reports being diagnosed asthmatic bronchitis. Pt also reporting earache on the left side.

## 2018-03-17 NOTE — ED Provider Notes (Addendum)
[facility]-EMERGENCY DEPT Provider Note   CSN: [id number] Arrival date & time: 03/17/18  0047     History   Chief Complaint No chief complaint on file.   HPI Kathryn Moon is a 47 y.o. female.  HPI  47 year old female comes in with chief complaint of cough. Patient reports that she has been coughing for the last several days.  Patient has headache when she coughs.  Patient has been diagnosed with asthmatic bronchitis, she has been taking breathing treatments at home without significant relief.  When patient call if she has headache that radiates down to the left side, and moves towards her neck.  Patient denies any associated numbness, tingling, vision changes, focal weakness or numbness.  Past Medical History:  Diagnosis Date  . Acne    on bactrim  . Anxiety   . Asthma   . Fibromyalgia     There are no active problems to display for this patient.   Past Surgical History:  Procedure Laterality Date  . TUBAL LIGATION       OB History    Gravida  4   Para  3   Term  3   Preterm  0   AB  1   Living  3     SAB  0   TAB  1   Ectopic  0   Multiple  0   Live Births               Home Medications    Prior to Admission medications   Medication Sig Start Date End Date Taking? Authorizing Provider  albuterol (PROVENTIL) (2.5 MG/3ML) 0.083% nebulizer solution Take 3 mLs (2.5 mg total) by nebulization every 6 (six) hours as needed for wheezing or shortness of breath. 07/16/17  Yes McDonald, Mia A, PA-C  ALPRAZolam (XANAX) 0.5 MG tablet Take 0.5 mg by mouth daily as needed for anxiety or sleep. Takes for anxiety    Yes [provider]  benzonatate (TESSALON) 100 MG capsule Take 1 capsule (100 mg total) by mouth every 8 (eight) hours as needed for cough. 03/03/18  Yes Cardama, Amadeo Garnet, MD  ciprofloxacin-dexamethasone (CIPRODEX) OTIC suspension Place 4 drops into both ears 2 (two) times daily.   Yes [provider]  fluticasone (FLONASE) 50 MCG/ACT nasal spray Place 1 spray into both nostrils daily.    Yes [provider]  ibuprofen (ADVIL,MOTRIN) 200 MG tablet Take 200 mg by mouth every 6 (six) hours as needed for moderate pain. Pain     Yes [provider]  latanoprost (XALATAN) 0.005 % ophthalmic solution Place 1 drop into both eyes at bedtime.   Yes [provider]  loratadine (CLARITIN) 10 MG tablet Take 10 mg by mouth daily.   Yes [provider]  Omega-3 Fatty Acids (FISH OIL) 1000 MG CAPS Take 1 capsule by mouth daily.   Yes [provider]  albuterol (PROVENTIL) (2.5 MG/3ML) 0.083% nebulizer solution Take 3 mLs (2.5 mg total) by nebulization every 6 (six) hours as needed for wheezing or shortness of breath. Patient not taking: Reported on 03/17/2018 07/26/16   Deatra Canter, FNP  azithromycin (ZITHROMAX) 250 MG tablet Take 1 tablet (250 mg total) by mouth daily. Take first 2 tablets together, then 1 every day until finished. Patient not taking: Reported on 03/03/2018 01/15/18   Alvino Chapel [place], PA-C  HYDROcodone-acetaminophen (HYCET) 7.5-325 mg/15 ml solution Take 15 mLs by mouth every 8 (eight) hours as needed for  moderate pain. Patient not taking: Reported on 03/03/2018 12/01/13   Antony MaduraHumes, Kelly, PA-C  naproxen (NAPROSYN) 500 MG tablet Take 1 tablet (500 mg total) by mouth 2 (two) times daily. Patient not taking: Reported on 03/03/2018 12/01/13   Antony MaduraHumes, Kelly, PA-C  predniSONE (DELTASONE) 10 MG tablet Take 4 tablets (40 mg total) by mouth daily. 03/18/18   Derwood KaplanNanavati, Idella Lamontagne, MD  promethazine-dextromethorphan (PROMETHAZINE-DM) 6.25-15 MG/5ML syrup Take 5 mLs by mouth 4 (four) times daily as needed for cough. Patient not taking: Reported on 03/17/2018 07/19/17   Irean HongSung, Jade J, MD  traMADol (ULTRAM) 50 MG tablet Take 1 tablet (50 mg total) by mouth every 6 (six) hours as needed. Patient not taking: Reported on 03/03/2018 12/12/15   Servando Salinaossi, Catherine H, NP      Family History Family History  Problem Relation Age of Onset  . Anesthesia problems Neg Hx     Social History Social History   Tobacco Use  . Smoking status: Never Smoker  . Smokeless tobacco: Never Used  Substance Use Topics  . Alcohol use: No  . Drug use: No     Allergies   Patient has no known allergies.   Review of Systems Review of Systems  Constitutional: Positive for activity change.  Eyes: Negative for visual disturbance.  Respiratory: Positive for cough and wheezing.   Gastrointestinal: Negative for nausea.  Neurological: Positive for headaches. Negative for dizziness, syncope, facial asymmetry, speech difficulty, weakness, light-headedness and numbness.  All other systems reviewed and are negative.    Physical Exam Updated Vital Signs BP (!) 158/91   Pulse 86   Temp 98.2 F (36.8 C) (Oral)   Resp 20   SpO2 100%   Physical Exam  Constitutional: She is oriented to person, place, and time. She appears well-developed.  HENT:  Head: Normocephalic and atraumatic.  Eyes: EOM are normal.  Neck: Normal range of motion. Neck supple.  Cardiovascular: Normal rate.  Pulmonary/Chest: Effort normal. No stridor. She has wheezes. She has no rales.  Coarse rhonchi diffusely and end expiratory wheezing  Abdominal: Bowel sounds are normal.  Neurological: She is alert and oriented to person, place, and time.  Skin: Skin is warm and dry.  Nursing note and vitals reviewed.    ED Treatments / Results  Labs (all labs ordered are listed, but only abnormal results are displayed) Labs Reviewed  CBG MONITORING, ED    EKG None  Radiology Dg Chest 2 View  Result Date: 03/17/2018 CLINICAL DATA:  Cough.  Difficulty breathing. EXAM: CHEST - 2 VIEW COMPARISON:  Radiograph 01/15/2018 FINDINGS: Persistent low lung volumes. The cardiomediastinal contours are normal for degree of inspiration. Right greater than left basilar atelectasis. Bronchovascular crowding versus  bronchial thickening. No consolidation, pleural effusion, or pneumothorax. No acute osseous abnormalities are seen. IMPRESSION: Low lung volumes with bibasilar atelectasis. Bronchovascular crowding versus bronchial thickening. Electronically Signed   By: Rubye OaksMelanie  Ehinger M.D.   On: 03/17/2018 05:11    Procedures Procedures (including critical care time)  Medications Ordered in ED Medications  albuterol (PROVENTIL,VENTOLIN) solution continuous neb (10 mg/hr Nebulization Given 03/17/18 0555)  ipratropium (ATROVENT) nebulizer solution 0.5 mg (0.5 mg Nebulization Given 03/17/18 0555)  predniSONE (DELTASONE) tablet 60 mg (60 mg Oral Given 03/17/18 0518)     Initial Impression / Assessment and Plan / ED Course  I have reviewed the triage vital signs and the nursing notes.  Pertinent labs & imaging results that were available during my care of the patient were reviewed by me  and considered in my medical decision making (see chart for details).  Clinical Course as of Mar 17 713  Sat Mar 17, 2018  6045 Repeat exam reveals clearing of wheezing in all lung fields. Patient is not in any respiratory distress nor is there hypoxia.    [AN]    Clinical Course User Index [AN] Derwood Kaplan, MD    47 year old female comes in primarily with chief complaint of cough.  She has been having cough since June, and has already finished a course of antibiotics without significant relief.  On exam she has coarse rhonchi and end expiratory wheezing diffusely.  Patient is not in any respiratory distress.  Nebulizer treatment has been ordered.  Additionally, patient also has been complaining of headache over the past few days.  Her headache is worse with cough, and the pain radiates from frontal part of her head to her neck, going below the ear.  There is no associated hearing loss.  Patient has no neurologic symptoms, complaints and her neuro exam is nonfocal.  Ear exam is also normal.  There is no trismus,  meningismus, cervical lymphadenopathy, crepitus -all of which is reassuring.  I doubt that patient had carotid dissection, although we entertain the idea given her tendency for violent cough.  Patient has been advised to return to the ER if she starts having worsening of her symptoms or if she has new neurologic symptoms, and she is okay with that plan.  Final Clinical Impressions(s) / ED Diagnoses   Final diagnoses:  Acute bronchitis, unspecified organism  Neck pain    ED Discharge Orders         Ordered    predniSONE (DELTASONE) 10 MG tablet  Daily     03/17/18 0713           Derwood Kaplan, MD 03/17/18 4098    Derwood Kaplan, MD 03/17/18 [phone]

## 2018-03-17 NOTE — Discharge Instructions (Signed)
Take the medications prescribed for optimal care of the viral bronchitis to be see on the x-rays. We are not sure what exactly is causing your pain in the neck-head.  If you start having worsening of the symptoms, or you start developing new vision changes, numbness, tingling, weakness, confusion, speech difficulty please return to the ER right away.

## 2018-09-18 ENCOUNTER — Emergency Department (HOSPITAL_COMMUNITY): Payer: Self-pay

## 2018-09-18 ENCOUNTER — Emergency Department (HOSPITAL_COMMUNITY)
Admission: EM | Admit: 2018-09-18 | Discharge: 2018-09-18 | Disposition: A | Discharge status: Home / Self Care | Payer: Self-pay | Attending: Emergency Medicine | Admitting: Emergency Medicine

## 2018-09-18 ENCOUNTER — Encounter (HOSPITAL_COMMUNITY): Payer: Self-pay | Admitting: Emergency Medicine

## 2018-09-18 ENCOUNTER — Other Ambulatory Visit: Payer: Self-pay

## 2018-09-18 DIAGNOSIS — Z79899 Other long term (current) drug therapy: Secondary | ICD-10-CM | POA: Insufficient documentation

## 2018-09-18 DIAGNOSIS — H401130 Primary open-angle glaucoma, bilateral, stage unspecified: Secondary | ICD-10-CM | POA: Insufficient documentation

## 2018-09-18 DIAGNOSIS — R42 Dizziness and giddiness: Secondary | ICD-10-CM | POA: Insufficient documentation

## 2018-09-18 DIAGNOSIS — J40 Bronchitis, not specified as acute or chronic: Secondary | ICD-10-CM | POA: Insufficient documentation

## 2018-09-18 DIAGNOSIS — H66005 Acute suppurative otitis media without spontaneous rupture of ear drum, recurrent, left ear: Secondary | ICD-10-CM | POA: Insufficient documentation

## 2018-09-18 LAB — CBC WITH DIFFERENTIAL/PLATELET
Abs Immature Granulocytes: 0 10*3/uL (ref 0.00–0.07)
Basophils Absolute: 0 10*3/uL (ref 0.0–0.1)
Basophils Relative: 0 %
Eosinophils Absolute: 0.2 10*3/uL (ref 0.0–0.5)
Eosinophils Relative: 2 %
HCT: 39.5 % (ref 36.0–46.0)
Hemoglobin: 12.8 g/dL (ref 12.0–15.0)
Lymphocytes Relative: 29 %
Lymphs Abs: 3.3 10*3/uL (ref 0.7–4.0)
MCH: 27.6 pg (ref 26.0–34.0)
MCHC: 32.4 g/dL (ref 30.0–36.0)
MCV: 85.1 fL (ref 80.0–100.0)
Monocytes Absolute: 0.6 10*3/uL (ref 0.1–1.0)
Monocytes Relative: 5 %
NRBC: 0 % (ref 0.0–0.2)
Neutro Abs: 7.3 10*3/uL (ref 1.7–7.7)
Neutrophils Relative %: 64 %
Platelets: 257 10*3/uL (ref 150–400)
RBC: 4.64 MIL/uL (ref 3.87–5.11)
RDW: 13.1 % (ref 11.5–15.5)
WBC: 11.4 10*3/uL — ABNORMAL HIGH (ref 4.0–10.5)
nRBC: 0 /100 WBC

## 2018-09-18 LAB — BASIC METABOLIC PANEL
Anion gap: 11 (ref 5–15)
BUN: 19 mg/dL (ref 6–20)
CO2: 22 mmol/L (ref 22–32)
Calcium: 9 mg/dL (ref 8.9–10.3)
Chloride: 104 mmol/L (ref 98–111)
Creatinine, Ser: 0.6 mg/dL (ref 0.44–1.00)
GFR calc Af Amer: >60 mL/min (ref >60)
GFR calc non Af Amer: >60 mL/min (ref >60)
Glucose, Bld: 103 mg/dL — ABNORMAL HIGH (ref 70–99)
Potassium: 3.5 mmol/L (ref 3.5–5.1)
Sodium: 137 mmol/L (ref 135–145)

## 2018-09-18 LAB — URINALYSIS, ROUTINE W REFLEX MICROSCOPIC
Bilirubin Urine: NEGATIVE
Glucose, UA: NEGATIVE mg/dL
Hgb urine dipstick: NEGATIVE
Ketones, ur: NEGATIVE mg/dL
Nitrite: NEGATIVE
Protein, ur: NEGATIVE mg/dL
Specific Gravity, Urine: 1.02 (ref 1.005–1.030)
pH: 6 (ref 5.0–8.0)

## 2018-09-18 LAB — LACTIC ACID, PLASMA: Lactic Acid, Venous: 1 mmol/L (ref 0.5–1.9)

## 2018-09-18 MED ORDER — IPRATROPIUM-ALBUTEROL 0.5-2.5 (3) MG/3ML IN SOLN
3.0000 mL | Freq: Once | RESPIRATORY_TRACT | Status: AC
Start: 2018-09-18 — End: 2018-09-18
  Administered 2018-09-18: 3 mL via RESPIRATORY_TRACT
  Filled 2018-09-18 (×2): qty 3

## 2018-09-18 MED ORDER — VANCOMYCIN HCL IN DEXTROSE 1-5 GM/200ML-% IV SOLN: 1000.0000 mg | INTRAVENOUS | Status: DC

## 2018-09-18 MED ORDER — PROMETHAZINE-CODEINE 6.25-10 MG/5ML PO SYRP: 5.0000 mL | ORAL_SOLUTION | Freq: Four times a day (QID) | ORAL | 0 refills | Status: DC | PRN

## 2018-09-18 MED ORDER — AMOXICILLIN-POT CLAVULANATE 875-125 MG PO TABS: 1.0000 | ORAL_TABLET | Freq: Two times a day (BID) | ORAL | 0 refills | Status: AC

## 2018-09-18 MED ORDER — SODIUM CHLORIDE 0.9 % IV SOLN
Freq: Once | INTRAVENOUS | Status: AC
  Administered 2018-09-18: 20:00:00 via INTRAVENOUS

## 2018-09-18 MED ORDER — METOCLOPRAMIDE HCL 5 MG/ML IJ SOLN
10.0000 mg | Freq: Once | INTRAMUSCULAR | Status: AC
  Administered 2018-09-18: 10 mg via INTRAVENOUS
  Filled 2018-09-18: qty 2

## 2018-09-18 MED ORDER — DEXAMETHASONE SODIUM PHOSPHATE 10 MG/ML IJ SOLN
10.0000 mg | Freq: Once | INTRAMUSCULAR | Status: AC
  Administered 2018-09-18: 10 mg via INTRAVENOUS
  Filled 2018-09-18: qty 1

## 2018-09-18 MED ORDER — TETRACAINE HCL 0.5 % OP SOLN
2.0000 [drp] | Freq: Once | OPHTHALMIC | Status: AC
  Administered 2018-09-18: 2 [drp] via OPHTHALMIC
  Filled 2018-09-18: qty 4

## 2018-09-18 MED ORDER — DIPHENHYDRAMINE HCL 50 MG/ML IJ SOLN
25.0000 mg | Freq: Once | INTRAMUSCULAR | Status: AC
  Administered 2018-09-18: 25 mg via INTRAVENOUS
  Filled 2018-09-18: qty 1

## 2018-09-18 MED ORDER — PIPERACILLIN-TAZOBACTAM 3.375 G IVPB 30 MIN
3.3750 g | Freq: Once | INTRAVENOUS | Status: AC
  Administered 2018-09-18: 3.375 g via INTRAVENOUS
  Filled 2018-09-18: qty 50

## 2018-09-18 MED ORDER — VANCOMYCIN HCL 10 G IV SOLR
2000.0000 mg | INTRAVENOUS | Status: AC
  Administered 2018-09-18: 2000 mg via INTRAVENOUS
  Filled 2018-09-18: qty 2000

## 2018-09-18 MED ORDER — PROCHLORPERAZINE MALEATE 10 MG PO TABS: 10.0000 mg | ORAL_TABLET | Freq: Two times a day (BID) | ORAL | 0 refills | Status: DC | PRN

## 2018-09-18 MED ORDER — LATANOPROST 0.005 % OP SOLN: 1.0000 [drp] | Freq: Every day | OPHTHALMIC | 1 refills | Status: DC

## 2018-09-18 NOTE — ED Triage Notes (Signed)
Patient c/o left ear pain and dizziness with movement x3 weeks. Reports cough x1 week. Also c/o bilateral eye pain and blurred vision for months. Reports hx glaucoma and has not used drops in approximately one year.

## 2018-09-18 NOTE — ED Notes (Signed)
BC #2 collected

## 2018-09-18 NOTE — ED Notes (Signed)
Bed: XI33 Expected date:  Expected time:  Means of arrival:  Comments: T6

## 2018-09-18 NOTE — ED Provider Notes (Signed)
MSE was initiated and I personally evaluated the patient and placed orders (if any) at  6:01 PM on September 18, 2018.  Kathryn Moon is a 48 y.o. female here today with multiple complaints. Patient first reports that she has been having dizziness and headache for over a week. She reports having a severe headache. Patient has hx of glaucoma and had been on drops from the eye doctor until she lost her insurance and could not go back for further treatment. She has not had her eye drops in over a year. Patient reports change in vision and blurry vision and eye pain for several months. Patient also reports cough and wheezing for the past week that is getting worse with shortness of breath.  The patient appears stable so that the remainder of the MSE may be completed by another provider. Patient c/o left ear pain that is severe. She saw her doctor for the pain and was started on ciprodex Otic drops. Patient reports that the pain has increased.   BP (!) 159/101   Pulse 94   Temp 98.5 F (36.9 C) (Oral)   Resp 18   SpO2 98%   Right TM is normal, Left TM dull, erythema. Lungs wheezing bilateral, decreased breath sounds.  CXR and duoneb ordered. Patient will be moved to the Acute side of the ED for continued treatment.    Kerrie Buffalo [place], [place] 09/18/18 Mallie Snooks    Lorre Nick, MD 09/20/18 1028

## 2018-09-18 NOTE — ED Notes (Signed)
ED Provider at bedside. Will update vital signs when he is finished.

## 2018-09-18 NOTE — ED Notes (Signed)
Tonopen at bedside.  

## 2018-09-18 NOTE — Progress Notes (Signed)
A consult was received from an ED physician for Vancomycin per pharmacy dosing.  The patient's profile has been reviewed for ht/wt/allergies/indication/available labs.    Updated weight = 103 kg  A one time order has been placed for Vancomycin 2000mg  IV x 1.  Further antibiotics/pharmacy consults should be ordered by admitting physician if indicated.                       Thank you, Maryellen Pile, PharmD 09/18/2018  8:38 PM

## 2018-09-18 NOTE — ED Provider Notes (Signed)
[facility]-EMERGENCY DEPT Provider Note   CSN: [id number] Arrival date & time: 09/18/18  1533    History   Chief Complaint Chief Complaint  Patient presents with  . Cough  . Otalgia  . Eye Pain    HPI Kathryn Moon is a 48 y.o. female.     HPI 48 year old female with past medical history of fibromyalgia, asthma, here with multiple complaints.  Patient primary complaint is left ear pain.  She has recently been put on eardrops and has had minimal improvement with this.  The patient reports that over the last several days, she has had aching, throbbing, left ear pain with associated fogginess sensation in her head.  She denies any overt neck pain or neck stiffness.  She has some left oral pain and fullness, but no overt headache.  No photophobia.  No phonophobia.  She has some mild dizziness intermittently, but this seems to come and go.  She has history of recurrent ear infections.  Denies any mastoid pain or tenderness.  She also notes a mild cough with some occasional production of sputum.  No chest pain.  No abdominal pain, nausea, or vomiting.  Past Medical History:  Diagnosis Date  . Acne    on bactrim  . Anxiety   . Asthma   . Fibromyalgia     There are no active problems to display for this patient.   Past Surgical History:  Procedure Laterality Date  . TUBAL LIGATION       OB History    Gravida  4   Para  3   Term  3   Preterm  0   AB  1   Living  3     SAB  0   TAB  1   Ectopic  0   Multiple  0   Live Births               Home Medications    Prior to Admission medications   Medication Sig Start Date End Date Taking? Authorizing Provider  Albuterol Sulfate (PROAIR HFA IN) Inhale 2 puffs into the lungs every 4 (four) hours as needed (wheezing, shortness of breath).   Yes [provider]  ALPRAZolam Prudy Feeler) 0.5 MG tablet Take 0.5 mg by mouth daily as needed for anxiety or sleep. Takes for anxiety    Yes  [provider]  ciprofloxacin-dexamethasone (CIPRODEX) OTIC suspension Place 4 drops into both ears 2 (two) times daily.   Yes [provider]  fluticasone (FLONASE) 50 MCG/ACT nasal spray Place 1 spray into both nostrils daily.    Yes [provider]  ibuprofen (ADVIL,MOTRIN) 200 MG tablet Take 400 mg by mouth every 4 (four) hours as needed for moderate pain. Pain     Yes [provider]  Omega-3 Fatty Acids (FISH OIL) 1000 MG CAPS Take 1 capsule by mouth daily.   Yes [provider]  albuterol (PROVENTIL) (2.5 MG/3ML) 0.083% nebulizer solution Take 3 mLs (2.5 mg total) by nebulization every 6 (six) hours as needed for wheezing or shortness of breath. 07/26/16   Deatra Canter, FNP  albuterol (PROVENTIL) (2.5 MG/3ML) 0.083% nebulizer solution Take 3 mLs (2.5 mg total) by nebulization every 6 (six) hours as needed for wheezing or shortness of breath. Patient not taking: Reported on 09/18/2018 07/16/17   Frederik Pear A, PA-C  amoxicillin-clavulanate (AUGMENTIN) 875-125 MG tablet Take 1 tablet by mouth every 12 (twelve) hours for 10 days. 09/18/18 09/28/18  Shaune Pollack, MD  azithromycin (ZITHROMAX) 250 MG tablet Take 1 tablet (250 mg total) by mouth daily. Take first 2 tablets together, then 1 every day until finished. Patient not taking: Reported on 09/18/2018 01/15/18   Wurst, [place], PA-C  benzonatate (TESSALON) 100 MG capsule Take 1 capsule (100 mg total) by mouth every 8 (eight) hours as needed for cough. Patient not taking: Reported on 09/18/2018 03/03/18   Nira Conn, MD  HYDROcodone-acetaminophen (HYCET) 7.5-325 mg/15 ml solution Take 15 mLs by mouth every 8 (eight) hours as needed for moderate pain. Patient not taking: Reported on 09/18/2018 12/01/13   Antony Madura, PA-C  latanoprost (XALATAN) 0.005 % ophthalmic solution Place 1 drop into both eyes at bedtime. 09/18/18   Shaune Pollack, MD  naproxen (NAPROSYN) 500 MG tablet Take 1 tablet  (500 mg total) by mouth 2 (two) times daily. Patient not taking: Reported on 09/18/2018 12/01/13   Antony Madura, PA-C  predniSONE (DELTASONE) 10 MG tablet Take 4 tablets (40 mg total) by mouth daily. Patient not taking: Reported on 09/18/2018 03/18/18   Derwood Kaplan, MD  prochlorperazine (COMPAZINE) 10 MG tablet Take 1 tablet (10 mg total) by mouth 2 (two) times daily as needed (headache). 09/18/18   Shaune Pollack, MD  promethazine-codeine (PHENERGAN WITH CODEINE) 6.25-10 MG/5ML syrup Take 5 mLs by mouth every 6 (six) hours as needed for cough. 09/18/18   Shaune Pollack, MD  promethazine-dextromethorphan (PROMETHAZINE-DM) 6.25-15 MG/5ML syrup Take 5 mLs by mouth 4 (four) times daily as needed for cough. Patient not taking: Reported on 09/18/2018 07/19/17   Irean Hong, MD  traMADol (ULTRAM) 50 MG tablet Take 1 tablet (50 mg total) by mouth every 6 (six) hours as needed. Patient not taking: Reported on 09/18/2018 12/12/15   Servando Salina, NP    Family History Family History  Problem Relation Age of Onset  . Anesthesia problems Neg Hx     Social History Social History   Tobacco Use  . Smoking status: Never Smoker  . Smokeless tobacco: Never Used  Substance Use Topics  . Alcohol use: No  . Drug use: No     Allergies   Patient has no known allergies.   Review of Systems Review of Systems  Constitutional: Positive for fatigue. Negative for chills and fever.  HENT: Positive for congestion and ear pain. Negative for rhinorrhea.   Eyes: Negative for visual disturbance.  Respiratory: Positive for cough, shortness of breath and wheezing.   Cardiovascular: Negative for chest pain and leg swelling.  Gastrointestinal: Negative for abdominal pain, diarrhea, nausea and vomiting.  Genitourinary: Negative for dysuria and flank pain.  Musculoskeletal: Negative for neck pain and neck stiffness.  Skin: Negative for rash and wound.  Allergic/Immunologic: Negative for immunocompromised  state.  Neurological: Positive for dizziness. Negative for syncope, weakness and headaches.  All other systems reviewed and are negative.    Physical Exam Updated Vital Signs BP (!) 106/93   Pulse 81   Temp 98.5 F (36.9 C) (Oral)   Resp 16   Wt 103 kg   SpO2 96%   BMI 42.89 kg/m   Physical Exam Vitals signs and nursing note reviewed.  Constitutional:      General: She is not in acute distress.    Appearance: She is well-developed.  HENT:     Head: Normocephalic and atraumatic.     Comments: Left tympanic membrane erythematous, opaque, and bulging.  External auditory canal minimally erythematous but not edematous with no discharge or drainage.  Left mastoid nontender, without erythema or swelling.  Right tympanic membrane, external canal, and mastoid normal.  Mild nasal congestion.  Mild posterior pharyngeal erythema. Eyes:     Conjunctiva/sclera: Conjunctivae normal.  Neck:     Musculoskeletal: Neck supple.  Cardiovascular:     Rate and Rhythm: Normal rate and regular rhythm.     Heart sounds: Normal heart sounds. No murmur. No friction rub.  Pulmonary:     Effort: Pulmonary effort is normal. No respiratory distress.     Breath sounds: Examination of the right-upper field reveals wheezing. Examination of the left-upper field reveals wheezing. Examination of the right-middle field reveals wheezing. Examination of the left-middle field reveals wheezing. Examination of the right-lower field reveals wheezing. Examination of the left-lower field reveals wheezing. Decreased breath sounds and wheezing present. No rales.  Abdominal:     General: There is no distension.     Palpations: Abdomen is soft.     Tenderness: There is no abdominal tenderness.  Skin:    General: Skin is warm.     Capillary Refill: Capillary refill takes less than 2 seconds.  Neurological:     Mental Status: She is alert and oriented to person, place, and time.     Motor: No abnormal muscle tone.       ED Treatments / Results  Labs (all labs ordered are listed, but only abnormal results are displayed) Labs Reviewed  CBC WITH DIFFERENTIAL/PLATELET - Abnormal; Notable for the following components:      Result Value   WBC 11.4 (*)    All other components within normal limits  BASIC METABOLIC PANEL - Abnormal; Notable for the following components:   Glucose, Bld 103 (*)    All other components within normal limits  URINALYSIS, ROUTINE W REFLEX MICROSCOPIC - Abnormal; Notable for the following components:   APPearance HAZY (*)    Leukocytes,Ua TRACE (*)    Bacteria, UA FEW (*)    All other components within normal limits  CULTURE, BLOOD (ROUTINE X 2)  CULTURE, BLOOD (ROUTINE X 2)  LACTIC ACID, PLASMA    EKG None  Radiology Dg Chest 2 View  Result Date: 09/18/2018 CLINICAL DATA:  Cough EXAM: CHEST - 2 VIEW COMPARISON:  03/17/2018 FINDINGS: Heart and mediastinal contours are within normal limits. No focal opacities or effusions. No acute bony abnormality. IMPRESSION: No active cardiopulmonary disease. Electronically Signed   By: Charlett Nose M.D.   On: 09/18/2018 18:32   Ct Head Wo Contrast  Result Date: 09/18/2018 CLINICAL DATA:  48 year old female with a history of dizziness EXAM: CT HEAD WITHOUT CONTRAST TECHNIQUE: Contiguous axial images were obtained from the base of the skull through the vertex without intravenous contrast. COMPARISON:  03/26/2013 FINDINGS: Brain: No acute intracranial hemorrhage. No midline shift or mass effect. Gray-white differentiation maintained. Unremarkable appearance of the ventricular system. Vascular: Unremarkable. Skull: No acute fracture.  No aggressive bone lesion identified. Sinuses/Orbits: Unremarkable appearance of the orbits. Mastoid air cells clear. No middle ear effusion. No significant sinus disease. Other: Left middle ear fluid and mastoid effusion. IMPRESSION: Negative for acute intracranial abnormality. Left middle ear effusion and  associated mastoid effusion. Electronically Signed   By: Gilmer Mor D.O.   On: 09/18/2018 19:25    Procedures Procedures (including critical care time)  Medications Ordered in ED Medications  ipratropium-albuterol (DUONEB) 0.5-2.5 (3) MG/3ML nebulizer solution 3 mL (3 mLs Nebulization Given 09/18/18 1844)  metoCLOPramide (REGLAN) injection 10 mg (10 mg Intravenous Given 09/18/18 2001)  diphenhydrAMINE (  BENADRYL) injection 25 mg (25 mg Intravenous Given 09/18/18 2004)  0.9 %  sodium chloride infusion ( Intravenous Stopped 09/18/18 2325)  tetracaine (PONTOCAINE) 0.5 % ophthalmic solution 2 drop (2 drops Both Eyes Given 09/18/18 2009)  piperacillin-tazobactam (ZOSYN) IVPB 3.375 g (0 g Intravenous Stopped 09/18/18 2049)  vancomycin (VANCOCIN) 2,000 mg in sodium chloride 0.9 % 500 mL IVPB (0 mg Intravenous Stopped 09/18/18 2330)  dexamethasone (DECADRON) injection 10 mg (10 mg Intravenous Given 09/18/18 2331)     Initial Impression / Assessment and Plan / ED Course  I have reviewed the triage vital signs and the nursing notes.  Pertinent labs & imaging results that were available during my care of the patient were reviewed by me and considered in my medical decision making (see chart for details).        48 yo F here with multiple complaints.  Primary c/o left-sided headache and ear pain. Exam is c/w AOM of left ear. No signs of active externa. Mastoid effusion noted on CT but pt has no fever, no left shift, no mastoid TTP or erythema, no fluctuance, no ear asymmetry, and I do not clinically suspect mastoiditis. Nonetheless, given her degree of OM, ABX initially given while labs pending.  Labs reassuring. Pt has on signs of sepsis. She has no ongoing HA, neck pain/stiffness, or signs of meningitis or encephalitis. Will treat for AOM, likely mild bronchitis. Decadron given for component of asthma and pt has had resolution of wheezing w/ nebs. Has nebs at home. Otherwise, will refill her glaucoma  meds (IOP 19 OU, vision @ baseline, no focal deficits). Discharge home w/ good return precautions.  Final Clinical Impressions(s) / ED Diagnoses   Final diagnoses:  Recurrent acute suppurative otitis media without spontaneous rupture of left tympanic membrane  Bronchitis  Primary open angle glaucoma of both eyes, unspecified glaucoma stage    ED Discharge Orders         Ordered    amoxicillin-clavulanate (AUGMENTIN) 875-125 MG tablet  Every 12 hours     09/18/18 2245    latanoprost (XALATAN) 0.005 % ophthalmic solution  Daily at bedtime     09/18/18 2245    promethazine-codeine (PHENERGAN WITH CODEINE) 6.25-10 MG/5ML syrup  Every 6 hours PRN     09/18/18 2245    prochlorperazine (COMPAZINE) 10 MG tablet  2 times daily PRN     09/18/18 2246           Shaune Pollack, MD 09/18/18 2358

## 2018-09-23 LAB — CULTURE, BLOOD (ROUTINE X 2)
Culture: NO GROWTH
Culture: NO GROWTH
Special Requests: ADEQUATE
Special Requests: ADEQUATE

## 2019-05-07 ENCOUNTER — Other Ambulatory Visit: Payer: Self-pay

## 2019-05-07 ENCOUNTER — Emergency Department (HOSPITAL_COMMUNITY)
Admission: EM | Admit: 2019-05-07 | Discharge: 2019-05-08 | Disposition: A | Discharge status: Home / Self Care | Payer: Self-pay | Attending: Emergency Medicine | Admitting: Emergency Medicine

## 2019-05-07 ENCOUNTER — Encounter (HOSPITAL_COMMUNITY): Payer: Self-pay

## 2019-05-07 DIAGNOSIS — J45909 Unspecified asthma, uncomplicated: Secondary | ICD-10-CM | POA: Insufficient documentation

## 2019-05-07 DIAGNOSIS — Z79899 Other long term (current) drug therapy: Secondary | ICD-10-CM | POA: Insufficient documentation

## 2019-05-07 DIAGNOSIS — K625 Hemorrhage of anus and rectum: Secondary | ICD-10-CM | POA: Insufficient documentation

## 2019-05-07 LAB — I-STAT BETA HCG BLOOD, ED (MC, WL, AP ONLY): I-stat hCG, quantitative: <5 m[IU]/mL (ref <5)

## 2019-05-07 LAB — CBC
HCT: 40.3 % (ref 36.0–46.0)
Hemoglobin: 13.7 g/dL (ref 12.0–15.0)
MCH: 30.6 pg (ref 26.0–34.0)
MCHC: 34 g/dL (ref 30.0–36.0)
MCV: 90 fL (ref 80.0–100.0)
Platelets: 217 10*3/uL (ref 150–400)
RBC: 4.48 MIL/uL (ref 3.87–5.11)
RDW: 13.2 % (ref 11.5–15.5)
WBC: 8.2 10*3/uL (ref 4.0–10.5)
nRBC: 0 % (ref 0.0–0.2)

## 2019-05-07 LAB — POC OCCULT BLOOD, ED: Fecal Occult Bld: NEGATIVE

## 2019-05-07 MED ORDER — SODIUM CHLORIDE 0.9 % IV BOLUS
1000.0000 mL | Freq: Once | INTRAVENOUS | Status: AC
  Administered 2019-05-07: 1000 mL via INTRAVENOUS

## 2019-05-07 NOTE — ED Triage Notes (Signed)
Pt complains of rectal bleeding for three days, [facility] told her to come here for evaluation because they're on call tonight,  Pt says that her abdomen is distended and she feels nauseated

## 2019-05-08 ENCOUNTER — Emergency Department (HOSPITAL_COMMUNITY): Payer: Self-pay

## 2019-05-08 ENCOUNTER — Encounter (HOSPITAL_COMMUNITY): Payer: Self-pay

## 2019-05-08 LAB — COMPREHENSIVE METABOLIC PANEL
ALT: 40 U/L (ref 0–44)
AST: 51 U/L — ABNORMAL HIGH (ref 15–41)
Albumin: 3.6 g/dL (ref 3.5–5.0)
Alkaline Phosphatase: 67 U/L (ref 38–126)
Anion gap: 16 — ABNORMAL HIGH (ref 5–15)
BUN: 9 mg/dL (ref 6–20)
CO2: 21 mmol/L — ABNORMAL LOW (ref 22–32)
Calcium: 9 mg/dL (ref 8.9–10.3)
Chloride: 96 mmol/L — ABNORMAL LOW (ref 98–111)
Creatinine, Ser: 0.47 mg/dL (ref 0.44–1.00)
GFR calc Af Amer: >60 mL/min (ref >60)
GFR calc non Af Amer: >60 mL/min (ref >60)
Glucose, Bld: 246 mg/dL — ABNORMAL HIGH (ref 70–99)
Potassium: 4.3 mmol/L (ref 3.5–5.1)
Sodium: 133 mmol/L — ABNORMAL LOW (ref 135–145)
Total Bilirubin: 0.6 mg/dL (ref 0.3–1.2)
Total Protein: 6 g/dL — ABNORMAL LOW (ref 6.5–8.1)

## 2019-05-08 LAB — TYPE AND SCREEN
ABO/RH(D): A NEG
Antibody Screen: NEGATIVE

## 2019-05-08 LAB — ABO/RH: ABO/RH(D): A NEG

## 2019-05-08 MED ORDER — SODIUM CHLORIDE (PF) 0.9 % IJ SOLN
INTRAMUSCULAR | Status: AC
  Filled 2019-05-08: qty 50

## 2019-05-08 MED ORDER — IOHEXOL 300 MG/ML  SOLN
100.0000 mL | Freq: Once | INTRAMUSCULAR | Status: AC | PRN
  Administered 2019-05-08: 100 mL via INTRAVENOUS

## 2019-05-08 NOTE — ED Provider Notes (Signed)
[facility]-EMERGENCY DEPT Provider Note   CSN: [id number] Arrival date & time: 05/07/19  1945     History   Chief Complaint Chief Complaint  Patient presents with  . Rectal Bleeding    HPI Kathryn Moon is a 48 y.o. female.     Patient complains of rectal bleeding for 3 days.  Mild abdominal bloating.  She was told by her GI doctor to come to the emergency department to be seen.  The history is provided by the patient.  Rectal Bleeding Quality:  Bright red Amount:  Moderate Timing:  Constant Chronicity:  New Context: not anal fissures   Similar prior episodes: no   Relieved by:  Nothing Worsened by:  Nothing Ineffective treatments:  None tried Associated symptoms: no abdominal pain     Past Medical History:  Diagnosis Date  . Acne    on bactrim  . Anxiety   . Asthma   . Fibromyalgia     There are no active problems to display for this patient.   Past Surgical History:  Procedure Laterality Date  . TUBAL LIGATION       OB History    Gravida  4   Para  3   Term  3   Preterm  0   AB  1   Living  3     SAB  0   TAB  1   Ectopic  0   Multiple  0   Live Births               Home Medications    Prior to Admission medications   Medication Sig Start Date End Date Taking? Authorizing Provider  albuterol (PROVENTIL) (2.5 MG/3ML) 0.083% nebulizer solution Take 3 mLs (2.5 mg total) by nebulization every 6 (six) hours as needed for wheezing or shortness of breath. 07/26/16  Yes Deatra Canter, FNP  Albuterol Sulfate (PROAIR HFA IN) Inhale 2 puffs into the lungs every 4 (four) hours as needed (wheezing, shortness of breath).   Yes [provider]  ALPRAZolam Prudy Feeler) 0.5 MG tablet Take 0.5 mg by mouth daily as needed for anxiety or sleep. Takes for anxiety    Yes [provider]  fluticasone (FLONASE) 50 MCG/ACT nasal spray Place 1 spray into both nostrils daily.    Yes [provider]   ibuprofen (ADVIL,MOTRIN) 200 MG tablet Take 400 mg by mouth every 4 (four) hours as needed for moderate pain. Pain     Yes [provider]  promethazine (PHENERGAN) 25 MG tablet Take 25 mg by mouth every 4 (four) hours as needed for nausea/vomiting. 12/20/18  Yes [provider]  albuterol (PROVENTIL) (2.5 MG/3ML) 0.083% nebulizer solution Take 3 mLs (2.5 mg total) by nebulization every 6 (six) hours as needed for wheezing or shortness of breath. Patient not taking: Reported on 09/18/2018 07/16/17   McDonald, Pedro Earls A, PA-C  azithromycin (ZITHROMAX) 250 MG tablet Take 1 tablet (250 mg total) by mouth daily. Take first 2 tablets together, then 1 every day until finished. Patient not taking: Reported on 05/07/2019 01/15/18   Wurst, [place], PA-C  benzonatate (TESSALON) 100 MG capsule Take 1 capsule (100 mg total) by mouth every 8 (eight) hours as needed for cough. Patient not taking: Reported on 09/18/2018 03/03/18   Nira Conn, MD  ciprofloxacin-dexamethasone [facility]) OTIC suspension Place 4 drops into both ears 2 (two) times daily.    [provider]  HYDROcodone-acetaminophen (HYCET) 7.5-325 mg/15 ml solution  Take 15 mLs by mouth every 8 (eight) hours as needed for moderate pain. Patient not taking: Reported on 09/18/2018 12/01/13   Antonietta Breach, PA-C  latanoprost (XALATAN) 0.005 % ophthalmic solution Place 1 drop into both eyes at bedtime. Patient not taking: Reported on 05/07/2019 09/18/18   Duffy Bruce, MD  naproxen (NAPROSYN) 500 MG tablet Take 1 tablet (500 mg total) by mouth 2 (two) times daily. Patient not taking: Reported on 09/18/2018 12/01/13   Antonietta Breach, PA-C  predniSONE (DELTASONE) 10 MG tablet Take 4 tablets (40 mg total) by mouth daily. Patient not taking: Reported on 09/18/2018 03/18/18   Varney Biles, MD  prochlorperazine (COMPAZINE) 10 MG tablet Take 1 tablet (10 mg total) by mouth 2 (two) times daily as needed (headache). Patient not taking:  Reported on 05/07/2019 09/18/18   Duffy Bruce, MD  promethazine-codeine [facility] WITH CODEINE) 6.25-10 MG/5ML syrup Take 5 mLs by mouth every 6 (six) hours as needed for cough. Patient not taking: Reported on 05/07/2019 09/18/18   Duffy Bruce, MD  promethazine-dextromethorphan (PROMETHAZINE-DM) 6.25-15 MG/5ML syrup Take 5 mLs by mouth 4 (four) times daily as needed for cough. Patient not taking: Reported on 09/18/2018 07/19/17   Paulette Blanch, MD  traMADol (ULTRAM) 50 MG tablet Take 1 tablet (50 mg total) by mouth every 6 (six) hours as needed. Patient not taking: Reported on 09/18/2018 12/12/15   Nehemiah Settle, NP    Family History Family History  Problem Relation Age of Onset  . Anesthesia problems Neg Hx     Social History Social History   Tobacco Use  . Smoking status: Never Smoker  . Smokeless tobacco: Never Used  Substance Use Topics  . Alcohol use: No  . Drug use: No     Allergies   Patient has no known allergies.   Review of Systems Review of Systems  Constitutional: Negative for appetite change and fatigue.  HENT: Negative for congestion, ear discharge and sinus pressure.   Eyes: Negative for discharge.  Respiratory: Negative for cough.   Cardiovascular: Negative for chest pain.  Gastrointestinal: Positive for hematochezia. Negative for abdominal pain and diarrhea.       Rectal bleeding  Genitourinary: Negative for frequency and hematuria.  Musculoskeletal: Negative for back pain.  Skin: Negative for rash.  Neurological: Negative for seizures and headaches.  Psychiatric/Behavioral: Negative for hallucinations.     Physical Exam Updated Vital Signs BP (!) 143/76   Pulse 96   Temp 98.4 F (36.9 C) (Oral)   Resp 18   Ht 4\' 9"  (1.448 m)   Wt 104.3 kg   SpO2 97%   BMI 49.77 kg/m   Physical Exam Vitals signs and nursing note reviewed.  Constitutional:      Appearance: She is well-developed.  HENT:     Head: Normocephalic.     Nose: Nose  normal.  Eyes:     General: No scleral icterus.    Conjunctiva/sclera: Conjunctivae normal.  Neck:     Musculoskeletal: Neck supple.     Thyroid: No thyromegaly.  Cardiovascular:     Rate and Rhythm: Normal rate and regular rhythm.     Heart sounds: No murmur. No friction rub. No gallop.   Pulmonary:     Breath sounds: No stridor. No wheezing or rales.  Chest:     Chest wall: No tenderness.  Abdominal:     General: There is no distension.     Tenderness: There is no abdominal tenderness. There is no rebound.  Genitourinary:    Comments: Scant brown stool heme positive Musculoskeletal: Normal range of motion.  Lymphadenopathy:     Cervical: No cervical adenopathy.  Skin:    Findings: No erythema or rash.  Neurological:     Mental Status: She is oriented to person, place, and time.     Motor: No abnormal muscle tone.     Coordination: Coordination normal.  Psychiatric:        Behavior: Behavior normal.      ED Treatments / Results  Labs (all labs ordered are listed, but only abnormal results are displayed) Labs Reviewed  COMPREHENSIVE METABOLIC PANEL - Abnormal; Notable for the following components:      Result Value   Sodium 133 (*)    Chloride 96 (*)    CO2 21 (*)    Glucose, Bld 246 (*)    Total Protein 6.0 (*)    AST 51 (*)    Anion gap 16 (*)    All other components within normal limits  CBC  POC OCCULT BLOOD, ED  I-STAT BETA HCG BLOOD, ED ([facility], [facility], [facility] ONLY)  TYPE AND SCREEN  ABO/RH    EKG None  Radiology No results found.  Procedures Procedures (including critical care time)  Medications Ordered in ED Medications  sodium chloride 0.9 % bolus 1,000 mL (1,000 mLs Intravenous New Bag/Given 05/07/19 2209)     Initial Impression / Assessment and Plan / ED Course  I have reviewed the triage vital signs and the nursing notes.  Pertinent labs & imaging results that were available during my care of the patient were reviewed by me and considered in my  medical decision making (see chart for details).        Patient not orthostatic or anemic.  CT scan pending  Final Clinical Impressions(s) / ED Diagnoses   Final diagnoses:  None    ED Discharge Orders    None       Bethann BerkshireZammit, Fenris Cauble, MD 05/08/19 0040

## 2019-05-08 NOTE — ED Provider Notes (Signed)
313 case d/w Dr. Jannetta Quint of [facility].  No further work up at this time. Please have patient to call office in the am to be seen   [place], Amiliana Foutz, MD 05/08/19 [phone]

## 2019-06-19 IMAGING — MG DIGITAL SCREENING BILATERAL MAMMOGRAM WITH CAD
4 series · 4 of 4 positions shown · non-contrast
Comparison: Previous exam(s).

CLINICAL DATA: Screening.

EXAM:
DIGITAL SCREENING BILATERAL MAMMOGRAM WITH CAD

[L CC]
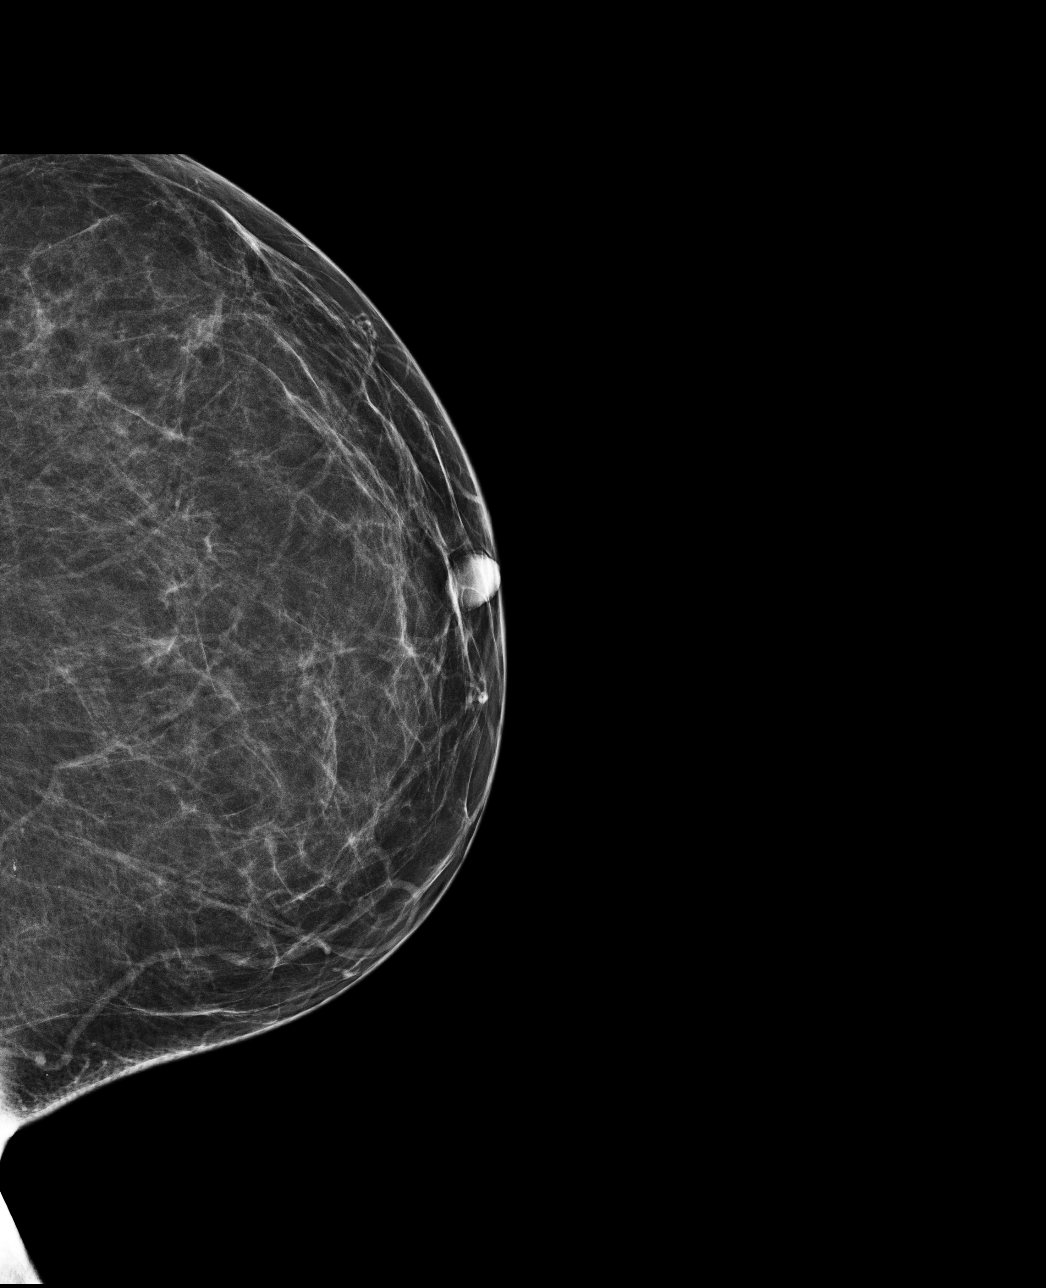

[L MLO]
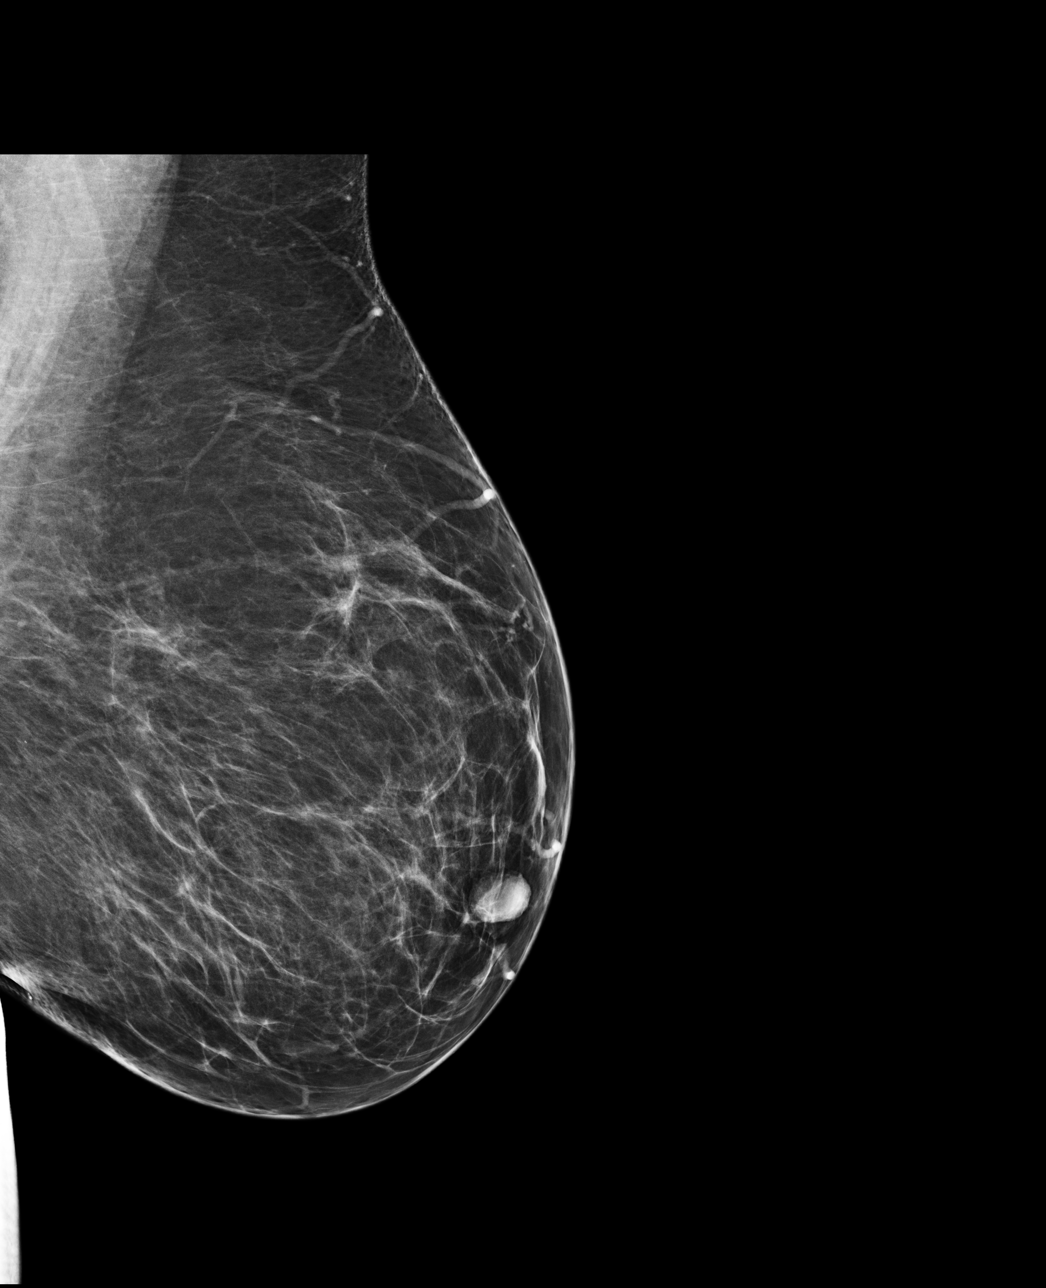

[R MLO]
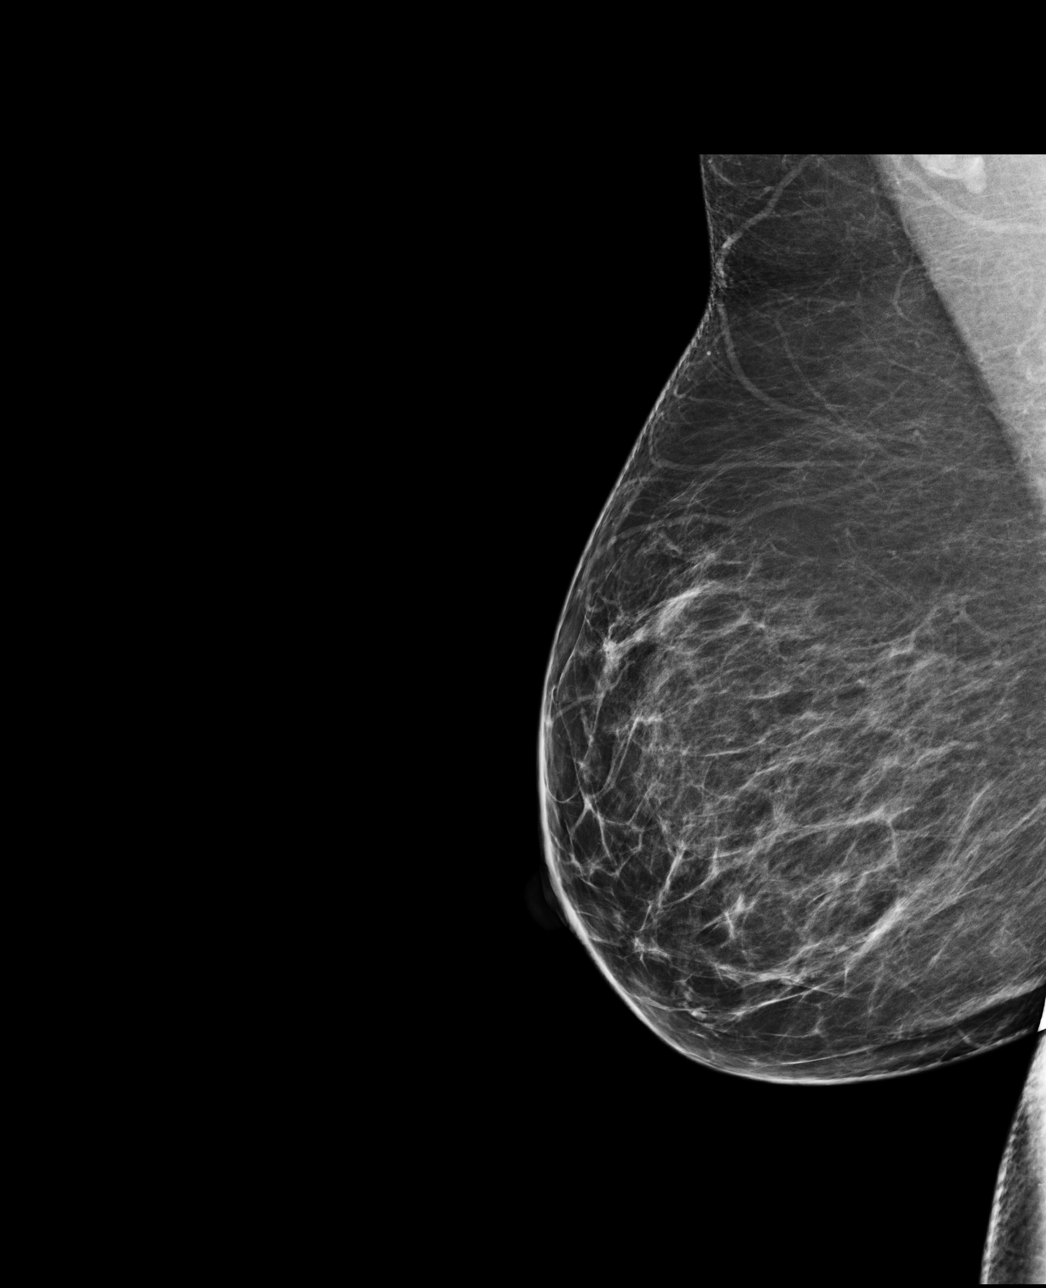

[R CC]
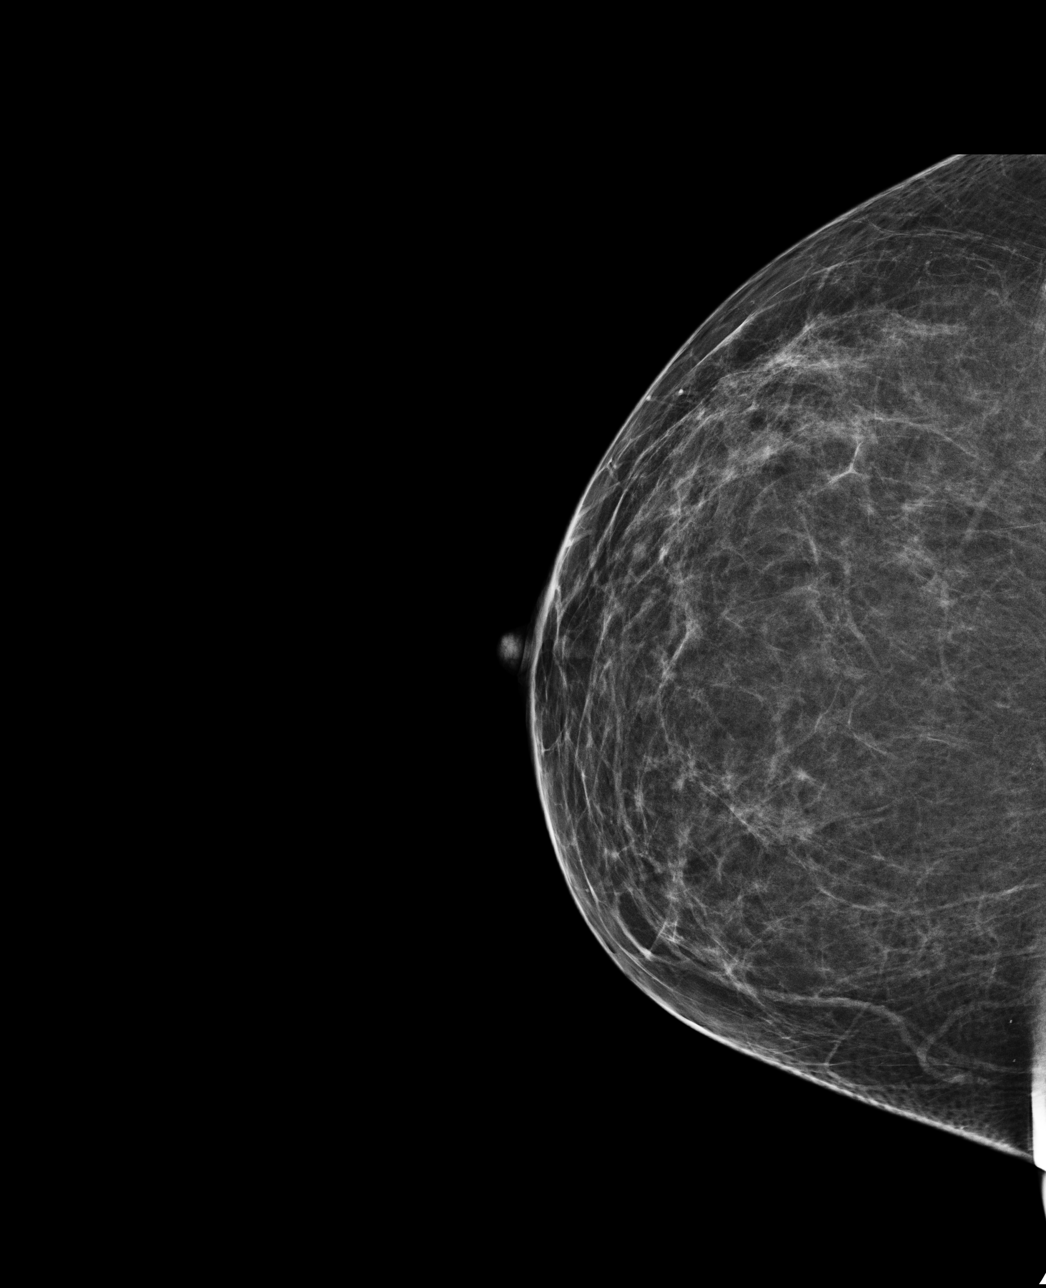

[4 of 4 positions shown; findings below may reference images not displayed]

ACR Breast Density Category b: There are scattered areas of
fibroglandular density.
FINDINGS: There are no findings suspicious for malignancy. Images were
processed with CAD.
IMPRESSION: No mammographic evidence of malignancy. A result letter of this
screening mammogram will be mailed directly to the patient.

RECOMMENDATION:
Screening mammogram in one year. (Code:AS-G-LCT)

BI-RADS CATEGORY  1: Negative.

## 2020-01-24 ENCOUNTER — Ambulatory Visit (INDEPENDENT_AMBULATORY_CARE_PROVIDER_SITE_OTHER): Payer: Self-pay

## 2020-01-24 ENCOUNTER — Other Ambulatory Visit: Payer: Self-pay

## 2020-01-24 ENCOUNTER — Ambulatory Visit (HOSPITAL_COMMUNITY)
Admission: EM | Admit: 2020-01-24 | Discharge: 2020-01-24 | Disposition: A | Discharge status: Home / Self Care | Payer: Self-pay | Attending: Family Medicine | Admitting: Family Medicine

## 2020-01-24 ENCOUNTER — Encounter (HOSPITAL_COMMUNITY): Payer: Self-pay

## 2020-01-24 DIAGNOSIS — S9032XA Contusion of left foot, initial encounter: Secondary | ICD-10-CM

## 2020-01-24 DIAGNOSIS — S6992XA Unspecified injury of left wrist, hand and finger(s), initial encounter: Secondary | ICD-10-CM

## 2020-01-24 NOTE — ED Triage Notes (Signed)
Pt c/o pain to lateral/top of foot for approx 1 week. States she dropped a can of food on it approx 1 month ago; c/o pain increase with walking. Pt states she just returned from a beach vacation and pain has increased with activity.   Some edema noted to left foot.  Foot warm to touch, brisk cap refill, +2 DP pulse.  No NSAIDs or pain relievers taken recently for pain.

## 2020-01-24 NOTE — ED Provider Notes (Signed)
[facility]    CSN: [id number] Arrival date & time: 01/24/20  1713      History   Chief Complaint Chief Complaint  Patient presents with  . Foot Pain    HPI Kathryn Moon is a 49 y.o. female.   HPI   Patient states that she dropped a heavy can on her foot about a month ago.  It was very bruised and painful.  She states that it is never gotten completely better.  It has improved, but still painful with a lot of weightbearing.  She did spend a week at the beach and had trouble walking on the sand.  She is limping.  She has to turn her foot in order to take it..  She feels like she has a broken bone in her foot.  Points to the lateral foot area.  Past Medical History:  Diagnosis Date  . Acne    on bactrim  . Anxiety   . Asthma   . Fibromyalgia     There are no problems to display for this patient.   Past Surgical History:  Procedure Laterality Date  . TUBAL LIGATION      OB History    Gravida  4   Para  3   Term  3   Preterm  0   AB  1   Living  3     SAB  0   TAB  1   Ectopic  0   Multiple  0   Live Births               Home Medications    Prior to Admission medications   Medication Sig Start Date End Date Taking? Authorizing Provider  ALPRAZolam Prudy Feeler) 0.5 MG tablet Take 0.5 mg by mouth daily as needed for anxiety or sleep. Takes for anxiety    Yes [provider]  ibuprofen (ADVIL,MOTRIN) 200 MG tablet Take 400 mg by mouth every 4 (four) hours as needed for moderate pain. Pain     Yes [provider]  fluticasone (FLONASE) 50 MCG/ACT nasal spray Place 1 spray into both nostrils daily.     [provider]  promethazine (PHENERGAN) 25 MG tablet Take 25 mg by mouth every 4 (four) hours as needed for nausea/vomiting. 12/20/18   [provider]  Albuterol Sulfate (PROAIR HFA IN) Inhale 2 puffs into the lungs every 4 (four) hours as needed (wheezing, shortness of breath).  01/24/20  [provider]  prochlorperazine (COMPAZINE) 10 MG tablet Take 1 tablet (10 mg total) by mouth 2 (two) times daily as needed (headache). Patient not taking: Reported on 05/07/2019 09/18/18 01/24/20  Shaune Pollack, MD    Family History Family History  Problem Relation Age of Onset  . Anesthesia problems Neg Hx     Social History Social History   Tobacco Use  . Smoking status: Never Smoker  . Smokeless tobacco: Never Used  Substance Use Topics  . Alcohol use: No  . Drug use: No     Allergies   Patient has no known allergies.   Review of Systems Review of Systems  Musculoskeletal: Positive for gait problem.     Physical Exam Triage Vital Signs ED Triage Vitals  Enc Vitals Group     BP 01/24/20 1819 (!) 147/103     Pulse Rate 01/24/20 1819 83     Resp 01/24/20 1819 16     Temp 01/24/20 1819 98.2 F (36.8 C)  Temp Source 01/24/20 1819 Oral     SpO2 01/24/20 1819 98 %     Weight --      Height --      Head Circumference --      Peak Flow --      Pain Score 01/24/20 1818 6     Pain Loc --      Pain Edu? --      Excl. in GC? --    No data found.  Updated Vital Signs BP (!) 147/103 (BP Location: Right Arm)   Pulse 83   Temp 98.2 F (36.8 C) (Oral)   Resp 16   LMP 01/20/2020 (Exact Date)   SpO2 98%      Physical Exam Constitutional:      General: She is not in acute distress.    Appearance: She is well-developed.  HENT:     Head: Normocephalic and atraumatic.  Eyes:     Conjunctiva/sclera: Conjunctivae normal.     Pupils: Pupils are equal, round, and reactive to light.  Cardiovascular:     Rate and Rhythm: Normal rate.  Pulmonary:     Effort: Pulmonary effort is normal. No respiratory distress.  Abdominal:     General: There is no distension.     Palpations: Abdomen is soft.  Musculoskeletal:        General: Normal range of motion.     Cervical back: Normal range of motion.       Feet:  Skin:    General: Skin is warm and dry.   Neurological:     General: No focal deficit present.     Mental Status: She is alert.  Psychiatric:     Comments: Talkative, garrulous      UC Treatments / Results  Labs (all labs ordered are listed, but only abnormal results are displayed) Labs Reviewed - No data to display  EKG   Radiology DG Foot Complete Left  Result Date: 01/24/2020 CLINICAL DATA:  Injury dropped can on foot EXAM: LEFT FOOT - COMPLETE 3+ VIEW COMPARISON:  None. FINDINGS: There is no evidence of fracture or dislocation. There is no evidence of arthropathy or other focal bone abnormality. Soft tissues are unremarkable. IMPRESSION: Negative. Electronically Signed   By: Jasmine Pang M.D.   On: 01/24/2020 19:26    Procedures Procedures (including critical care time)  Medications Ordered in UC Medications - No data to display  Initial Impression / Assessment and Plan / UC Course  I have reviewed the triage vital signs and the nursing notes.  Pertinent labs & imaging results that were available during my care of the patient were reviewed by me and considered in my medical decision making (see chart for details).      Final Clinical Impressions(s) / UC Diagnoses   Final diagnoses:  Contusion of left foot, initial encounter     Discharge Instructions     Walk as tolerated Reduce the number of miles your walking wear the fracture shoe as long as needed for pain  Take ibuprofen 800 mg 3 times a day See your primary care doctor or podiatry if you fail to improve  Follow up on the blood pressure    ED Prescriptions    None     PDMP not reviewed this encounter.   Eustace Moore, MD 01/24/20 Corky Crafts

## 2020-01-24 NOTE — Discharge Instructions (Addendum)
Walk as tolerated Reduce the number of miles your walking wear the fracture shoe as long as needed for pain  Take ibuprofen 800 mg 3 times a day See your primary care doctor or podiatry if you fail to improve  Follow up on the blood pressure

## 2020-02-06 ENCOUNTER — Encounter (HOSPITAL_COMMUNITY): Payer: Self-pay | Admitting: Emergency Medicine

## 2020-02-06 ENCOUNTER — Emergency Department (HOSPITAL_COMMUNITY)
Admission: EM | Admit: 2020-02-06 | Discharge: 2020-02-06 | Disposition: A | Discharge status: Home / Self Care | Payer: Self-pay | Attending: Emergency Medicine | Admitting: Emergency Medicine

## 2020-02-06 ENCOUNTER — Emergency Department (HOSPITAL_COMMUNITY): Payer: Self-pay

## 2020-02-06 DIAGNOSIS — R0981 Nasal congestion: Secondary | ICD-10-CM | POA: Insufficient documentation

## 2020-02-06 DIAGNOSIS — K625 Hemorrhage of anus and rectum: Secondary | ICD-10-CM | POA: Insufficient documentation

## 2020-02-06 DIAGNOSIS — Z20822 Contact with and (suspected) exposure to covid-19: Secondary | ICD-10-CM | POA: Insufficient documentation

## 2020-02-06 DIAGNOSIS — N3000 Acute cystitis without hematuria: Secondary | ICD-10-CM | POA: Insufficient documentation

## 2020-02-06 DIAGNOSIS — J45909 Unspecified asthma, uncomplicated: Secondary | ICD-10-CM | POA: Insufficient documentation

## 2020-02-06 LAB — URINALYSIS, ROUTINE W REFLEX MICROSCOPIC
Bilirubin Urine: NEGATIVE
Glucose, UA: NEGATIVE mg/dL
Hgb urine dipstick: NEGATIVE
Ketones, ur: NEGATIVE mg/dL
Nitrite: NEGATIVE
Protein, ur: NEGATIVE mg/dL
Specific Gravity, Urine: 1.019 (ref 1.005–1.030)
pH: 5 (ref 5.0–8.0)

## 2020-02-06 LAB — COMPREHENSIVE METABOLIC PANEL
ALT: 28 U/L (ref 0–44)
AST: 22 U/L (ref 15–41)
Albumin: 4.4 g/dL (ref 3.5–5.0)
Alkaline Phosphatase: 64 U/L (ref 38–126)
Anion gap: 13 (ref 5–15)
BUN: 10 mg/dL (ref 6–20)
CO2: 25 mmol/L (ref 22–32)
Calcium: 9.1 mg/dL (ref 8.9–10.3)
Chloride: 103 mmol/L (ref 98–111)
Creatinine, Ser: 0.47 mg/dL (ref 0.44–1.00)
GFR calc Af Amer: >60 mL/min (ref >60)
GFR calc non Af Amer: >60 mL/min (ref >60)
Glucose, Bld: 128 mg/dL — ABNORMAL HIGH (ref 70–99)
Potassium: 3.7 mmol/L (ref 3.5–5.1)
Sodium: 141 mmol/L (ref 135–145)
Total Bilirubin: 0.7 mg/dL (ref 0.3–1.2)
Total Protein: 7.3 g/dL (ref 6.5–8.1)

## 2020-02-06 LAB — I-STAT BETA HCG BLOOD, ED (MC, WL, AP ONLY): I-stat hCG, quantitative: <5 m[IU]/mL (ref <5)

## 2020-02-06 LAB — CBC
HCT: 42.4 % (ref 36.0–46.0)
Hemoglobin: 14.4 g/dL (ref 12.0–15.0)
MCH: 29.6 pg (ref 26.0–34.0)
MCHC: 34 g/dL (ref 30.0–36.0)
MCV: 87.1 fL (ref 80.0–100.0)
Platelets: 251 10*3/uL (ref 150–400)
RBC: 4.87 MIL/uL (ref 3.87–5.11)
RDW: 12.9 % (ref 11.5–15.5)
WBC: 7.2 10*3/uL (ref 4.0–10.5)
nRBC: 0 % (ref 0.0–0.2)

## 2020-02-06 LAB — POC OCCULT BLOOD, ED: Fecal Occult Bld: POSITIVE — AB

## 2020-02-06 LAB — LIPASE, BLOOD: Lipase: 21 U/L (ref 11–51)

## 2020-02-06 LAB — SARS CORONAVIRUS 2 BY RT PCR (HOSPITAL ORDER, PERFORMED IN ~~LOC~~ HOSPITAL LAB): SARS Coronavirus 2: NEGATIVE

## 2020-02-06 MED ORDER — ONDANSETRON HCL 4 MG/2ML IJ SOLN
4.0000 mg | Freq: Once | INTRAMUSCULAR | Status: AC
  Administered 2020-02-06: 4 mg via INTRAVENOUS
  Filled 2020-02-06: qty 2

## 2020-02-06 MED ORDER — PROMETHAZINE-DM 6.25-15 MG/5ML PO SYRP
2.5000 mL | ORAL_SOLUTION | Freq: Four times a day (QID) | ORAL | 0 refills | Status: DC | PRN
Start: 2020-02-06 — End: 2021-10-13

## 2020-02-06 MED ORDER — SODIUM CHLORIDE 0.9 % IV BOLUS
1000.0000 mL | Freq: Once | INTRAVENOUS | Status: AC
  Administered 2020-02-06: 1000 mL via INTRAVENOUS

## 2020-02-06 MED ORDER — SUCRALFATE 1 GM/10ML PO SUSP
1.0000 g | Freq: Three times a day (TID) | ORAL | 0 refills | Status: AC
Start: 2020-02-06 — End: ?

## 2020-02-06 MED ORDER — ONDANSETRON HCL 4 MG PO TABS: 4.0000 mg | ORAL_TABLET | Freq: Three times a day (TID) | ORAL | 0 refills | Status: DC | PRN

## 2020-02-06 MED ORDER — IOHEXOL 300 MG/ML  SOLN
100.0000 mL | Freq: Once | INTRAMUSCULAR | Status: AC | PRN
  Administered 2020-02-06: 100 mL via INTRAVENOUS

## 2020-02-06 MED ORDER — PANTOPRAZOLE SODIUM 40 MG IV SOLR
40.0000 mg | Freq: Once | INTRAVENOUS | Status: AC
  Administered 2020-02-06: 40 mg via INTRAVENOUS
  Filled 2020-02-06: qty 40

## 2020-02-06 MED ORDER — MORPHINE SULFATE (PF) 4 MG/ML IV SOLN
4.0000 mg | Freq: Once | INTRAVENOUS | Status: AC
  Administered 2020-02-06: 4 mg via INTRAVENOUS
  Filled 2020-02-06: qty 1

## 2020-02-06 MED ORDER — NITROFURANTOIN MONOHYD MACRO 100 MG PO CAPS: 100.0000 mg | ORAL_CAPSULE | Freq: Two times a day (BID) | ORAL | 0 refills | Status: DC

## 2020-02-06 MED ORDER — SODIUM CHLORIDE 0.9 % IV SOLN
1.0000 g | Freq: Once | INTRAVENOUS | Status: AC
  Administered 2020-02-06: 1 g via INTRAVENOUS
  Filled 2020-02-06: qty 10

## 2020-02-06 MED ORDER — FAMOTIDINE 20 MG PO TABS
20.0000 mg | ORAL_TABLET | Freq: Two times a day (BID) | ORAL | 0 refills | Status: AC
Start: 2020-02-06 — End: ?

## 2020-02-06 NOTE — ED Notes (Signed)
Pt ambulatory from triage 

## 2020-02-06 NOTE — ED Provider Notes (Addendum)
[facility]-EMERGENCY DEPT Provider Note   CSN: [id number] Arrival date & time: 02/06/20  1503     History Chief Complaint  Patient presents with  . Rectal Bleeding    Kathryn Moon is a 49 y.o. female with past medical history significant for GI bleed, fibromyalgia, anxiety, asthma who presents for evaluation multiple complaints.  Patient with nonproductive cough, congestion, rhinorrhea, myalgias, abdominal pain rectal bleeding.  Symptoms began 2 days ago.  Has had multiple episodes of NBNB emesis.  Passing flatulence.  Prior tubal ligation.  No rashes or lesions.  Did not get Covid vaccine.  Has not been around any Covid contacts.  She has generalized abdominal pain.  Dark stool at home.  Similar incidents in 2019.  Had colonoscopy which showed polyps.  Has not taken anything for her symptoms.  Occasionally take 800 mg ibuprofen however not daily or chronic user.  No history of alcohol abuse.  Symptoms intermittent x2 days.  No known history of hemorrhoids.  Denies fever, chills, lightheadedness, dizziness, chest pain, shortness of breath, dysuria.  Denies aggravating or alleviating factors.  History obtained from patient and past medical records.  No interpreter used.  HPI     Past Medical History:  Diagnosis Date  . Acne    on bactrim  . Anxiety   . Asthma   . Fibromyalgia     There are no problems to display for this patient.   Past Surgical History:  Procedure Laterality Date  . TUBAL LIGATION       OB History    Gravida  4   Para  3   Term  3   Preterm  0   AB  1   Living  3     SAB  0   TAB  1   Ectopic  0   Multiple  0   Live Births              Family History  Problem Relation Age of Onset  . Anesthesia problems Neg Hx     Social History   Tobacco Use  . Smoking status: Never Smoker  . Smokeless tobacco: Never Used  Substance Use Topics  . Alcohol use: No  . Drug use: No    Home Medications Prior to  Admission medications   Medication Sig Start Date End Date Taking? Authorizing Provider  albuterol (ACCUNEB) 0.63 MG/3ML nebulizer solution Take 1 ampule by nebulization every 6 (six) hours as needed for wheezing or shortness of breath.   Yes [provider]  ALPRAZolam Prudy Feeler) 0.5 MG tablet Take 0.25-0.5 mg by mouth daily as needed for anxiety or sleep. Takes for anxiety    Yes [provider]  B Complex Vitamins (VITAMIN B COMPLEX PO) Take 1 tablet by mouth daily.   Yes [provider]  cholecalciferol (VITAMIN D3) 25 MCG (1000 UNIT) tablet Take 1,000 Units by mouth daily.   Yes [provider]  fluticasone (FLONASE) 50 MCG/ACT nasal spray Place 1 spray into both nostrils daily.    Yes [provider]  ibuprofen (ADVIL) 800 MG tablet Take 800 mg by mouth 3 (three) times daily as needed for moderate pain.  12/13/19  Yes [provider]  loratadine (CLARITIN) 10 MG tablet Take 10 mg by mouth daily as needed for allergies.   Yes [provider]  Pedialyte (PEDIALYTE) SOLN Take 240 mLs by mouth daily.   Yes [provider]  promethazine (PHENERGAN) 25 MG tablet  Take 25 mg by mouth every 4 (four) hours as needed for nausea/vomiting. 12/20/18  Yes [provider]  famotidine (PEPCID) 20 MG tablet Take 1 tablet (20 mg total) by mouth 2 (two) times daily. 02/06/20   Travas Schexnayder A, PA-C  ibuprofen (ADVIL,MOTRIN) 200 MG tablet Take 400 mg by mouth every 4 (four) hours as needed for moderate pain. Pain   Patient not taking: Reported on 02/06/2020    [provider]  nitrofurantoin, macrocrystal-monohydrate, (MACROBID) 100 MG capsule Take 1 capsule (100 mg total) by mouth 2 (two) times daily. 02/06/20   Gaston Dase A, PA-C  ondansetron (ZOFRAN) 4 MG tablet Take 1 tablet (4 mg total) by mouth every 8 (eight) hours as needed for nausea or vomiting. 02/06/20   Benjamen Koelling A, PA-C  promethazine-dextromethorphan  (PROMETHAZINE-DM) 6.25-15 MG/5ML syrup Take 2.5 mLs by mouth 4 (four) times daily as needed for cough. 02/06/20   Krystina Strieter A, PA-C  sucralfate (CARAFATE) 1 GM/10ML suspension Take 10 mLs (1 g total) by mouth 4 (four) times daily -  with meals and at bedtime. 02/06/20   Tiya Schrupp A, PA-C  prochlorperazine (COMPAZINE) 10 MG tablet Take 1 tablet (10 mg total) by mouth 2 (two) times daily as needed (headache). Patient not taking: Reported on 05/07/2019 09/18/18 01/24/20  Shaune Pollack, MD    Allergies    Patient has no known allergies.  Review of Systems   Review of Systems  Constitutional: Positive for activity change, appetite change and fatigue.  HENT: Positive for congestion and rhinorrhea. Negative for sinus pressure, sinus pain, sore throat and trouble swallowing.   Respiratory: Positive for cough. Negative for apnea, choking, chest tightness, shortness of breath, wheezing and stridor.   Cardiovascular: Negative.   Gastrointestinal: Positive for abdominal pain, blood in stool, nausea and vomiting. Negative for constipation, diarrhea and rectal pain.  Genitourinary: Negative.   Musculoskeletal: Negative.   Skin: Negative.   Neurological: Negative.   All other systems reviewed and are negative.   Physical Exam Updated Vital Signs BP (!) 158/88 (BP Location: Right Arm)   Pulse 70   Temp 98.3 F (36.8 C) (Oral)   Resp 16   LMP 01/20/2020 (Exact Date)   SpO2 96%   Physical Exam Vitals and nursing note reviewed.  Constitutional:      General: She is not in acute distress.    Appearance: She is not ill-appearing, toxic-appearing or diaphoretic.  HENT:     Head: Normocephalic and atraumatic.     Jaw: There is normal jaw occlusion.     Right Ear: Tympanic membrane, ear canal and external ear normal. There is no impacted cerumen. No hemotympanum. Tympanic membrane is not injected, scarred, perforated, erythematous, retracted or bulging.     Left Ear: Tympanic membrane,  ear canal and external ear normal. There is no impacted cerumen. No hemotympanum. Tympanic membrane is not injected, scarred, perforated, erythematous, retracted or bulging.     Ears:     Comments: No Mastoid tenderness.    Nose: Nose normal.     Comments: Clear rhinorrhea and congestion to bilateral nares.  No sinus tenderness.    Mouth/Throat:     Mouth: Mucous membranes are moist.     Comments: Posterior oropharynx clear.  Mucous membranes moist.  Tonsils without erythema or exudate.  Uvula midline without deviation.  No evidence of PTA or RPA.  No drooling, dysphasia or trismus.  Phonation normal. Neck:     Trachea: Trachea and phonation normal.  Comments: No Neck stiffness or neck rigidity.  No meningismus.  No cervical lymphadenopathy. Cardiovascular:     Pulses: Normal pulses.          Radial pulses are 2+ on the right side and 2+ on the left side.       Dorsalis pedis pulses are 2+ on the right side and 2+ on the left side.     Heart sounds: Normal heart sounds.     Comments: No murmurs rubs or gallops. Pulmonary:     Effort: Pulmonary effort is normal.     Breath sounds: Normal breath sounds.     Comments: Clear to auscultation bilaterally without wheeze, rhonchi or rales.  No accessory muscle usage.  Able speak in full sentences. Abdominal:     General: Bowel sounds are normal.     Palpations: Abdomen is soft.     Tenderness: There is generalized abdominal tenderness. There is no right CVA tenderness, left CVA tenderness, guarding or rebound. Negative signs include Traynor's sign and McBurney's sign.     Hernia: No hernia is present.     Comments: Soft, nontender without rebound or guarding.  No CVA tenderness.  Genitourinary:    Rectum: Guaiac result positive. External hemorrhoid present. No tenderness or internal hemorrhoid.     Comments: No stool on rectal exam Musculoskeletal:     Cervical back: Full passive range of motion without pain and normal range of motion.      Comments: Moves all 4 extremities without difficulty.  Lower extremities without edema, erythema or warmth.  Skin:    Comments: Brisk capillary refill.  No rashes or lesions.  Neurological:     Mental Status: She is alert.     Comments: Ambulatory in department without difficulty.  Cranial nerves II through XII grossly intact.  No facial droop.  No aphasia.    ED Results / Procedures / Treatments   Labs (all labs ordered are listed, but only abnormal results are displayed) Labs Reviewed  COMPREHENSIVE METABOLIC PANEL - Abnormal; Notable for the following components:      Result Value   Glucose, Bld 128 (*)    All other components within normal limits  URINALYSIS, ROUTINE W REFLEX MICROSCOPIC - Abnormal; Notable for the following components:   APPearance HAZY (*)    Leukocytes,Ua TRACE (*)    Bacteria, UA RARE (*)    All other components within normal limits  POC OCCULT BLOOD, ED - Abnormal; Notable for the following components:   Fecal Occult Bld POSITIVE (*)    All other components within normal limits  SARS CORONAVIRUS 2 BY RT PCR (HOSPITAL ORDER, PERFORMED IN [facility])  URINE CULTURE  LIPASE, BLOOD  CBC  I-STAT BETA HCG BLOOD, ED ([facility], [facility], [facility] ONLY)   EKG None  Radiology CT Abdomen Pelvis W Contrast  Result Date: 02/06/2020 CLINICAL DATA:  Bright red blood per rectum, vomiting, abdominal pain for 3 days EXAM: CT ABDOMEN AND PELVIS WITH CONTRAST TECHNIQUE: Multidetector CT imaging of the abdomen and pelvis was performed using the standard protocol following bolus administration of intravenous contrast. CONTRAST:  OMNIPAQUE IOHEXOL 300 MG/ML  SOLN COMPARISON:  05/08/2019 FINDINGS: Lower chest: No acute pleural or parenchymal lung disease. Hepatobiliary: Stable hepatic steatosis. No focal liver abnormality. The gallbladder is unremarkable. Pancreas: Unremarkable. No pancreatic ductal dilatation or surrounding inflammatory changes. Spleen: Normal in size without  focal abnormality. Adrenals/Urinary Tract: Adrenal glands are unremarkable. Kidneys are normal, without renal calculi, focal lesion, or  hydronephrosis. Bladder is unremarkable. Stomach/Bowel: No bowel obstruction or ileus. Normal appendix right lower quadrant. No bowel wall thickening or inflammatory change. Vascular/Lymphatic: No significant vascular findings are present. No enlarged abdominal or pelvic lymph nodes. Reproductive: Uterus and bilateral adnexa are unremarkable. Other: No abdominal wall hernia or abnormality. No abdominopelvic ascites. Musculoskeletal: No acute or destructive bony lesions. Reconstructed images demonstrate no additional findings. IMPRESSION: 1. No acute intra-abdominal or intrapelvic process. 2. Stable hepatic steatosis. Electronically Signed   By: Sharlet SalinaMichael  Brown M.D.   On: 02/06/2020 19:16   DG Chest Portable 1 View  Result Date: 02/06/2020 CLINICAL DATA:  Shortness of breath and cough for 3 days.  Diabetes. EXAM: PORTABLE CHEST 1 VIEW COMPARISON:  09/18/2018 FINDINGS: Midline trachea. Normal heart size and mediastinal contours. No pleural effusion or pneumothorax. Clear lungs. IMPRESSION: No acute cardiopulmonary disease. Electronically Signed   By: Jeronimo GreavesKyle  Talbot M.D.   On: 02/06/2020 19:07    Procedures Procedures (including critical care time)  Medications Ordered in ED Medications  sodium chloride 0.9 % bolus 1,000 mL (0 mLs Intravenous Stopped 02/06/20 2030)  ondansetron (ZOFRAN) injection 4 mg (4 mg Intravenous Given 02/06/20 1833)  morphine 4 MG/ML injection 4 mg (4 mg Intravenous Given 02/06/20 1833)  pantoprazole (PROTONIX) injection 40 mg (40 mg Intravenous Given 02/06/20 1834)  iohexol (OMNIPAQUE) 300 MG/ML solution 100 mL (100 mLs Intravenous Contrast Given 02/06/20 1856)  cefTRIAXone (ROCEPHIN) 1 g in sodium chloride 0.9 % 100 mL IVPB (0 g Intravenous Stopped 02/06/20 2022)   ED Course  I have reviewed the triage vital signs and the nursing notes.  Pertinent  labs & imaging results that were available during my care of the patient were reviewed by me and considered in my medical decision making (see chart for details).  [age] old female presents for evaluation of multiple complaints.  She is afebrile, nonseptic, not ill-appearing.  Patient with upper respiratory complaints x2 days, cough, congestion, rhinorrhea, myalgias.  Did not receive Covid vaccine however is not been around anyone with Covid.  Patient also dark stool as well as some generalized abdominal pain.  Had similar episode 2 years ago, had colonoscopy with [facility] had polyps however no acute significant findings.  No chronic NSAID use, alcohol use.  Generalized myalgias however no meningismus. Low suspicion for meningitis. No overlying skin changes to suggest bacterial infectious process.  Plan on labs, imaging and reassess  Labs and imaging personally reviewed and interpreted: CBC CMP with mild hyperglycemia to 128, BUN 10 Pregnancy negative Occult negative UA with Trace leuks and rare bacteria, contaminated UA. No urinary complaints. Hold on ABX Lipase 21  Patient reassessed.  Asking for p.o. intake.  Reevaluation abdomen soft without rebound or guarding.  Pending CT scan  Patient reassessed.  CT abdomen pelvis no acute findings.  Patient states she now does have dysuria.  Will treat with Rocephin for UTI.  Discussed reassuring work-up.  She is comfortable DC home.  Will consult with GI for patient's close follow-up given her rectal bleeding.  [facility]  CONSULT with Dr. Ewing SchleinMagod with [facility]. Plan to follow up in office outpatient.  Patient does not meet the SIRS or Sepsis criteria.  On repeat exam patient does not have a surgical abdomin and there are no peritoneal signs.  No indication of appendicitis, bowel obstruction, bowel perforation, cholecystitis, diverticulitis, PID or ectopic pregnancy.    The patient has been appropriately medically screened and/or stabilized  in the ED. I  have low suspicion for any other emergent medical condition which would require further screening, evaluation or treatment in the ED or require inpatient management.  Patient is hemodynamically stable and in no acute distress.  Patient able to ambulate in department prior to ED.  Evaluation does not show acute pathology that would require ongoing or additional emergent interventions while in the emergency department or further inpatient treatment.  I have discussed the diagnosis with the patient and answered all questions.  Pain is been managed while in the emergency department and patient has no further complaints prior to discharge.  Patient is comfortable with plan discussed in room and is stable for discharge at this time.  I have discussed strict return precautions for returning to the emergency department.  Patient was encouraged to follow-up with PCP/specialist refer to at discharge.    MDM Rules/Calculators/A&P                           Final Clinical Impression(s) / ED Diagnoses Final diagnoses:  Rectal bleeding  Nasal congestion  Acute cystitis without hematuria    Rx / DC Orders ED Discharge Orders         Ordered    nitrofurantoin, macrocrystal-monohydrate, (MACROBID) 100 MG capsule  2 times daily     Discontinue  Reprint     02/06/20 2019    ondansetron (ZOFRAN) 4 MG tablet  Every 8 hours PRN     Discontinue  Reprint     02/06/20 2019    famotidine (PEPCID) 20 MG tablet  2 times daily     Discontinue  Reprint     02/06/20 2019    sucralfate (CARAFATE) 1 GM/10ML suspension  3 times daily with meals & bedtime     Discontinue  Reprint     02/06/20 2019    promethazine-dextromethorphan (PROMETHAZINE-DM) 6.25-15 MG/5ML syrup  4 times daily PRN     Discontinue  Reprint     02/06/20 2029              Mardell Cragg A, PA-C 02/06/20 2040    Pricilla Loveless, MD 02/07/20 0031

## 2020-02-06 NOTE — Discharge Instructions (Signed)
Macrobid for UTI  Carafate and Pepcid for GI bleed  Zofran for nausea. May also use the Phenergan you have a home instead of this medication.  Return for new or worsening symptoms.  COVID test negative CT without acute findings Labs without significant findings

## 2020-02-06 NOTE — ED Triage Notes (Signed)
Patient c/o bright red rectal bleeding, vomiting, and abdominal pain x3 days. Hx polyps. Denies blood in emesis. Patient also c/o fever, cough and congestion x4 days.

## 2020-02-08 LAB — URINE CULTURE: Culture: NO GROWTH

## 2020-04-16 ENCOUNTER — Ambulatory Visit (HOSPITAL_COMMUNITY)
Admission: EM | Admit: 2020-04-16 | Discharge: 2020-04-16 | Disposition: A | Discharge status: Home / Self Care | Payer: Self-pay

## 2020-04-16 ENCOUNTER — Other Ambulatory Visit: Payer: Self-pay

## 2020-07-03 ENCOUNTER — Ambulatory Visit (HOSPITAL_COMMUNITY)
Admission: EM | Admit: 2020-07-03 | Discharge: 2020-07-03 | Disposition: A | Discharge status: Home / Self Care | Payer: No Typology Code available for payment source | Attending: Internal Medicine | Admitting: Internal Medicine

## 2020-07-03 ENCOUNTER — Other Ambulatory Visit: Payer: Self-pay

## 2020-07-03 DIAGNOSIS — H933X3 Disorders of bilateral acoustic nerves: Secondary | ICD-10-CM | POA: Insufficient documentation

## 2020-07-03 DIAGNOSIS — Z113 Encounter for screening for infections with a predominantly sexual mode of transmission: Secondary | ICD-10-CM | POA: Insufficient documentation

## 2020-07-03 DIAGNOSIS — R030 Elevated blood-pressure reading, without diagnosis of hypertension: Secondary | ICD-10-CM | POA: Insufficient documentation

## 2020-07-03 LAB — POCT URINALYSIS DIPSTICK, ED / UC
Bilirubin Urine: NEGATIVE
Glucose, UA: NEGATIVE mg/dL
Hgb urine dipstick: NEGATIVE
Ketones, ur: NEGATIVE mg/dL
Nitrite: NEGATIVE
Protein, ur: NEGATIVE mg/dL
Specific Gravity, Urine: >=1.03 (ref 1.005–1.030)
Urobilinogen, UA: 0.2 mg/dL (ref 0.0–1.0)
pH: 6 (ref 5.0–8.0)

## 2020-07-03 LAB — CBG MONITORING, ED: Glucose-Capillary: 102 mg/dL — ABNORMAL HIGH (ref 70–99)

## 2020-07-03 MED ORDER — HYDROXYZINE HCL 25 MG PO TABS: 25.0000 mg | ORAL_TABLET | Freq: Three times a day (TID) | ORAL | 0 refills | Status: AC | PRN

## 2020-07-03 NOTE — Discharge Instructions (Addendum)
Increase physical activity Decrease salt intake

## 2020-07-03 NOTE — ED Triage Notes (Signed)
Pt presents with possible recurrent anxiety.

## 2020-07-03 NOTE — ED Notes (Signed)
After completion of initial triage patient stated " she was just here to get both her ears and her coochie checked because she feels they are not well"

## 2020-07-06 LAB — CERVICOVAGINAL ANCILLARY ONLY
Chlamydia: NEGATIVE
Comment: NEGATIVE
Comment: NEGATIVE
Comment: NORMAL
Neisseria Gonorrhea: NEGATIVE
Trichomonas: NEGATIVE

## 2020-07-07 NOTE — ED Provider Notes (Signed)
[facility]    CSN: [id number] Arrival date & time: 07/03/20  1820      History   Chief Complaint Chief Complaint  Patient presents with  . Anxiety    HPI Kathryn Moon is a 49 y.o. female comes to the urgent care with complaints of recurrent anxiety.  Recently got divorced after 25 years of marriage.  She has felt very anxious with the coming holiday season because this is the very first time that she will spend Christmas by herself.  She is also having anxiety episodes over the possible STD exposure.  She was involved in unprotected sexual encounter.  No vaginal discharge, dysuria, urgency or frequency.   Patient has elevated blood pressure.  She does not have a diagnosis of hypertension.  She is very anxious at this time.  She has some ringing in the ears and some difficulty hearing out of the left ear.  No upper respiratory infection symptoms.  She denies any dizziness or vertigo at this time.  HPI  Past Medical History:  Diagnosis Date  . Acne    on bactrim  . Anxiety   . Asthma   . Fibromyalgia     There are no problems to display for this patient.   Past Surgical History:  Procedure Laterality Date  . TUBAL LIGATION      OB History    Gravida  4   Para  3   Term  3   Preterm  0   AB  1   Living  3     SAB  0   IAB  1   Ectopic  0   Multiple  0   Live Births               Home Medications    Prior to Admission medications   Medication Sig Start Date End Date Taking? Authorizing Provider  albuterol (ACCUNEB) 0.63 MG/3ML nebulizer solution Take 1 ampule by nebulization every 6 (six) hours as needed for wheezing or shortness of breath.    [provider]  ALPRAZolam Prudy Feeler) 0.5 MG tablet Take 0.25-0.5 mg by mouth daily as needed for anxiety or sleep. Takes for anxiety     [provider]  B Complex Vitamins (VITAMIN B COMPLEX PO) Take 1 tablet by mouth daily.    [provider]  cholecalciferol  (VITAMIN D3) 25 MCG (1000 UNIT) tablet Take 1,000 Units by mouth daily.    [provider]  famotidine (PEPCID) 20 MG tablet Take 1 tablet (20 mg total) by mouth 2 (two) times daily. 02/06/20   Henderly, Britni A, PA-C  fluticasone (FLONASE) 50 MCG/ACT nasal spray Place 1 spray into both nostrils daily.     [provider]  hydrOXYzine (ATARAX/VISTARIL) 25 MG tablet Take 1 tablet (25 mg total) by mouth every 8 (eight) hours as needed for anxiety. 07/03/20   Merrilee Jansky, MD  loratadine (CLARITIN) 10 MG tablet Take 10 mg by mouth daily as needed for allergies.    [provider]  ondansetron (ZOFRAN) 4 MG tablet Take 1 tablet (4 mg total) by mouth every 8 (eight) hours as needed for nausea or vomiting. 02/06/20   Henderly, Britni A, PA-C  Pedialyte (PEDIALYTE) SOLN Take 240 mLs by mouth daily.    [provider]  promethazine (PHENERGAN) 25 MG tablet Take 25 mg by mouth every 4 (four) hours as needed for nausea/vomiting. 12/20/18   [provider]  promethazine-dextromethorphan (PROMETHAZINE-DM) 6.25-15  MG/5ML syrup Take 2.5 mLs by mouth 4 (four) times daily as needed for cough. 02/06/20   Henderly, Britni A, PA-C  sucralfate (CARAFATE) 1 GM/10ML suspension Take 10 mLs (1 g total) by mouth 4 (four) times daily -  with meals and at bedtime. 02/06/20   Henderly, Britni A, PA-C  prochlorperazine (COMPAZINE) 10 MG tablet Take 1 tablet (10 mg total) by mouth 2 (two) times daily as needed (headache). Patient not taking: Reported on 05/07/2019 09/18/18 01/24/20  Shaune Pollack, MD    Family History Family History  Problem Relation Age of Onset  . Anesthesia problems Neg Hx     Social History Social History   Tobacco Use  . Smoking status: Never Smoker  . Smokeless tobacco: Never Used  Substance Use Topics  . Alcohol use: No  . Drug use: No     Allergies   Patient has no known allergies.   Review of Systems Review of Systems  Genitourinary:  Negative for dysuria, frequency, urgency and vaginal discharge.  Skin: Negative.   Neurological: Negative for dizziness, light-headedness and headaches.  Psychiatric/Behavioral: Negative for confusion, self-injury and suicidal ideas. The patient is nervous/anxious.      Physical Exam Triage Vital Signs ED Triage Vitals  Enc Vitals Group     BP 07/03/20 1848 (!) 180/102     Pulse Rate 07/03/20 1848 77     Resp 07/03/20 1848 20     Temp 07/03/20 1848 98 F (36.7 C)     Temp Source 07/03/20 1848 Oral     SpO2 07/03/20 1848 96 %     Weight --      Height --      Head Circumference --      Peak Flow --      Pain Score 07/03/20 1850 5     Pain Loc --      Pain Edu? --      Excl. in GC? --    No data found.  Updated Vital Signs BP (!) 195/105 (BP Location: Left Arm)   Pulse 77   Temp 98 F (36.7 C) (Oral)   Resp 20   SpO2 96%   Visual Acuity Right Eye Distance:   Left Eye Distance:   Bilateral Distance:    Right Eye Near:   Left Eye Near:    Bilateral Near:     Physical Exam Vitals reviewed.  Constitutional:      General: She is not in acute distress.    Appearance: Normal appearance. She is not ill-appearing.  Cardiovascular:     Rate and Rhythm: Normal rate and regular rhythm.     Pulses: Normal pulses.     Heart sounds: Normal heart sounds.  Pulmonary:     Effort: Pulmonary effort is normal.     Breath sounds: Normal breath sounds.  Musculoskeletal:        General: Normal range of motion.  Skin:    General: Skin is warm.     Capillary Refill: Capillary refill takes less than 2 seconds.      UC Treatments / Results  Labs (all labs ordered are listed, but only abnormal results are displayed) Labs Reviewed  POCT URINALYSIS DIPSTICK, ED / UC - Abnormal; Notable for the following components:      Result Value   Leukocytes,Ua TRACE (*)    All other components within normal limits  CBG MONITORING, ED - Abnormal; Notable for the following components:    Glucose-Capillary 102 (*)  All other components within normal limits  CERVICOVAGINAL ANCILLARY ONLY    EKG   Radiology No results found.  Procedures Procedures (including critical care time)  Medications Ordered in UC Medications - No data to display  Initial Impression / Assessment and Plan / UC Course  I have reviewed the triage vital signs and the nursing notes.  Pertinent labs & imaging results that were available during my care of the patient were reviewed by me and considered in my medical decision making (see chart for details).     1.  Screen for STD: Cervicovaginal swab for GC/chlamydia/trichomonas If the labs results are abnormal, we will call you with recommendations  2.  Anxiety episodes: Hydroxyzine as needed for anxiety  3.  Acoustic neuritis affecting both ears: Hydroxyzine for dizziness If symptoms worsen return to the urgent care to be reevaluated  4.  Elevated blood pressure without diagnosis of hypertension: Patient is advised to monitor her blood pressure Hopefully hydroxyzine will help with the anxiety.  I encouraged her to use her alprazolam.  Hopefully her blood pressure will improve. Final Clinical Impressions(s) / UC Diagnoses   Final diagnoses:  Screen for STD (sexually transmitted disease)  Acoustic neuritis affecting both ears  Elevated blood-pressure reading without diagnosis of hypertension     Discharge Instructions     Increase physical activity Decrease salt intake    ED Prescriptions    Medication Sig Dispense Auth. Provider   hydrOXYzine (ATARAX/VISTARIL) 25 MG tablet Take 1 tablet (25 mg total) by mouth every 8 (eight) hours as needed for anxiety. 12 tablet Dayne Dekay, Britta Mccreedy, MD     PDMP not reviewed this encounter.   Merrilee Jansky, MD 07/07/20 1944

## 2020-08-28 ENCOUNTER — Ambulatory Visit (HOSPITAL_COMMUNITY)
Admission: EM | Admit: 2020-08-28 | Discharge: 2020-08-28 | Disposition: A | Discharge status: Home / Self Care | Payer: Medicaid Other | Attending: Medical Oncology | Admitting: Medical Oncology

## 2020-08-28 ENCOUNTER — Encounter (HOSPITAL_COMMUNITY): Payer: Self-pay

## 2020-08-28 ENCOUNTER — Other Ambulatory Visit: Payer: Self-pay

## 2020-08-28 DIAGNOSIS — H66003 Acute suppurative otitis media without spontaneous rupture of ear drum, bilateral: Secondary | ICD-10-CM

## 2020-08-28 MED ORDER — AMOXICILLIN-POT CLAVULANATE 875-125 MG PO TABS
1.0000 | ORAL_TABLET | Freq: Two times a day (BID) | ORAL | 0 refills | Status: AC
Start: 2020-08-28 — End: 2020-09-07

## 2020-08-28 NOTE — ED Provider Notes (Addendum)
[facility]    CSN: [id number] Arrival date & time: 08/28/20  1848      History   Chief Complaint Chief Complaint  Patient presents with  . Tinnitus  . Otalgia    HPI Kathryn Moon is a 50 y.o. female.   HPI   Tinnitus and otalgia: Patient with a history of otitis media and eustachian tube dysfunction presents with a 2 to 3-week history of bilateral ear pain and tinnitus.  She believes that she likely has an ear infection as she has had many throughout her life and symptoms feel similar.  She has no ear trauma, fevers, cold symptoms, chest pain or shortness of breath but was diagnosed with COVID-19 on 08/09/2020.  She has not tried anything for symptoms yet.   She has an elevated blood pressure on initial intake without any chest pain, headache, and shortness of breath or peripheral edema.  She states that she has a long history of whitecoat syndrome.  Blood pressure at home has been normal.   Past Medical History:  Diagnosis Date  . Acne    on bactrim  . Anxiety   . Asthma   . Fibromyalgia     There are no problems to display for this patient.   Past Surgical History:  Procedure Laterality Date  . TUBAL LIGATION      OB History    Gravida  4   Para  3   Term  3   Preterm  0   AB  1   Living  3     SAB  0   IAB  1   Ectopic  0   Multiple  0   Live Births               Home Medications    Prior to Admission medications   Medication Sig Start Date End Date Taking? Authorizing Provider  albuterol (ACCUNEB) 0.63 MG/3ML nebulizer solution Take 1 ampule by nebulization every 6 (six) hours as needed for wheezing or shortness of breath.    [provider]  ALPRAZolam Prudy Feeler) 0.5 MG tablet Take 0.25-0.5 mg by mouth daily as needed for anxiety or sleep. Takes for anxiety     [provider]  B Complex Vitamins (VITAMIN B COMPLEX PO) Take 1 tablet by mouth daily.    [provider]  cholecalciferol (VITAMIN  D3) 25 MCG (1000 UNIT) tablet Take 1,000 Units by mouth daily.    [provider]  famotidine (PEPCID) 20 MG tablet Take 1 tablet (20 mg total) by mouth 2 (two) times daily. 02/06/20   Henderly, Britni A, PA-C  fluticasone (FLONASE) 50 MCG/ACT nasal spray Place 1 spray into both nostrils daily.     [provider]  hydrOXYzine (ATARAX/VISTARIL) 25 MG tablet Take 1 tablet (25 mg total) by mouth every 8 (eight) hours as needed for anxiety. 07/03/20   Merrilee Jansky, MD  loratadine (CLARITIN) 10 MG tablet Take 10 mg by mouth daily as needed for allergies.    [provider]  ondansetron (ZOFRAN) 4 MG tablet Take 1 tablet (4 mg total) by mouth every 8 (eight) hours as needed for nausea or vomiting. 02/06/20   Henderly, Britni A, PA-C  Pedialyte (PEDIALYTE) SOLN Take 240 mLs by mouth daily.    [provider]  promethazine (PHENERGAN) 25 MG tablet Take 25 mg by mouth every 4 (four) hours as needed for nausea/vomiting. 12/20/18   [provider]  promethazine-dextromethorphan (PROMETHAZINE-DM)  6.25-15 MG/5ML syrup Take 2.5 mLs by mouth 4 (four) times daily as needed for cough. 02/06/20   Henderly, Britni A, PA-C  sucralfate (CARAFATE) 1 GM/10ML suspension Take 10 mLs (1 g total) by mouth 4 (four) times daily -  with meals and at bedtime. 02/06/20   Henderly, Britni A, PA-C  prochlorperazine (COMPAZINE) 10 MG tablet Take 1 tablet (10 mg total) by mouth 2 (two) times daily as needed (headache). Patient not taking: Reported on 05/07/2019 09/18/18 01/24/20  Shaune Pollack, MD    Family History Family History  Problem Relation Age of Onset  . Anesthesia problems Neg Hx     Social History Social History   Tobacco Use  . Smoking status: Never Smoker  . Smokeless tobacco: Never Used  Substance Use Topics  . Alcohol use: No  . Drug use: No     Allergies   Other   Review of Systems Review of Systems  As stated above in HPI Physical Exam Triage Vital  Signs ED Triage Vitals  Enc Vitals Group     BP 08/28/20 1921 (!) 184/90     Pulse Rate 08/28/20 1921 86     Resp 08/28/20 1921 19     Temp 08/28/20 1921 98.4 F (36.9 C)     Temp Source 08/28/20 1921 Oral     SpO2 08/28/20 1921 97 %     Weight --      Height --      Head Circumference --      Peak Flow --      Pain Score 08/28/20 1919 5     Pain Loc --      Pain Edu? --      Excl. in GC? --    No data found.  Updated Vital Signs BP (!) 184/90 (BP Location: Left Arm)   Pulse 86   Temp 98.4 F (36.9 C) (Oral)   Resp 19   SpO2 97%   Physical Exam Vitals and nursing note reviewed.  Constitutional:      General: She is not in acute distress.    Appearance: Normal appearance. She is not ill-appearing, toxic-appearing or diaphoretic.  HENT:     Right Ear: No decreased hearing noted. No drainage or swelling. A middle ear effusion is present. No mastoid tenderness. No PE tube. No hemotympanum. Tympanic membrane is injected, erythematous and bulging. Tympanic membrane is not perforated.     Left Ear: No decreased hearing noted. No drainage or swelling. A middle ear effusion is present. No mastoid tenderness. No PE tube. No hemotympanum. Tympanic membrane is injected, erythematous and bulging. Tympanic membrane is not perforated.  Neurological:     Mental Status: She is alert.      UC Treatments / Results  Labs (all labs ordered are listed, but only abnormal results are displayed) Labs Reviewed - No data to display  EKG   Radiology No results found.  Procedures Procedures (including critical care time)  Medications Ordered in UC Medications - No data to display  Initial Impression / Assessment and Plan / UC Course  I have reviewed the triage vital signs and the nursing notes.  Pertinent labs & imaging results that were available during my care of the patient were reviewed by me and considered in my medical decision making (see chart for details).     New.   Given symptoms and length of symptoms will treat with Augmentin.  Discussed how to use along with common potential side effects and  precautions.  I want her to follow-up with her primary care provider and I would suggest a referral to a ENT as she has not seen one in many years.  We discussed red flag signs and symptoms that would warrant more immediate medical attention.  Final Clinical Impressions(s) / UC Diagnoses   Final diagnoses:  None   Discharge Instructions   None    ED Prescriptions    None     PDMP not reviewed this encounter.   Rushie Chestnut, Cordelia Poche 08/28/20 1939    Rushie Chestnut, PA-C 08/28/20 2005

## 2020-08-28 NOTE — ED Triage Notes (Signed)
Pt reports bilateral ear pain and tinnitus. States complaints started after she tested positive for COVID on 08/09/2020.

## 2020-12-10 ENCOUNTER — Encounter (HOSPITAL_COMMUNITY): Payer: Self-pay | Admitting: Emergency Medicine

## 2020-12-10 ENCOUNTER — Other Ambulatory Visit: Payer: Self-pay

## 2020-12-10 ENCOUNTER — Emergency Department (HOSPITAL_COMMUNITY)
Admission: EM | Admit: 2020-12-10 | Discharge: 2020-12-11 | Discharge status: Against Medical Advice | Payer: Medicaid Other | Attending: Emergency Medicine | Admitting: Emergency Medicine

## 2020-12-10 ENCOUNTER — Emergency Department (HOSPITAL_COMMUNITY): Payer: Medicaid Other

## 2020-12-10 DIAGNOSIS — W01198A Fall on same level from slipping, tripping and stumbling with subsequent striking against other object, initial encounter: Secondary | ICD-10-CM | POA: Insufficient documentation

## 2020-12-10 DIAGNOSIS — Y908 Blood alcohol level of 240 mg/100 ml or more: Secondary | ICD-10-CM | POA: Insufficient documentation

## 2020-12-10 DIAGNOSIS — S0990XA Unspecified injury of head, initial encounter: Secondary | ICD-10-CM | POA: Insufficient documentation

## 2020-12-10 DIAGNOSIS — F10129 Alcohol abuse with intoxication, unspecified: Secondary | ICD-10-CM | POA: Insufficient documentation

## 2020-12-10 NOTE — ED Triage Notes (Signed)
Patient presents with right forehead skin laceration approx.1/2 inch with minimal bleeding/swelling , fell this evening intoxicated with ETOH , poor historian at triage due to intoxication ,repirations unlabored .

## 2020-12-10 NOTE — ED Provider Notes (Signed)
MSE was initiated and I personally evaluated the patient and placed orders (if any) at  11:30 PM on Dec 10, 2020.  Patient to ED by friend who dropped her off stating she fell and hit her head on the sink, then left. The patient is altered, ?alcohol intoxicated (no history in chart). Facial injury.   Today's Vitals   12/10/20 2326  BP: 98/64  Pulse: 60  Resp: 16  Temp: 97.6 F (36.4 C)  TempSrc: Oral  SpO2: 94%   There is no height or weight on file to calculate BMI.  Patient has forehead wound Patient's speech incoherent Memory loss to event No vomiting   The patient appears stable so that the remainder of the MSE may be completed by another provider.   Elpidio Anis, PA-C 12/10/20 2332    Tegeler, Canary Brim, MD 12/11/20 [phone]

## 2020-12-11 ENCOUNTER — Emergency Department (HOSPITAL_COMMUNITY): Payer: Medicaid Other

## 2020-12-11 LAB — CBC WITH DIFFERENTIAL/PLATELET
Abs Immature Granulocytes: 0.13 10*3/uL — ABNORMAL HIGH (ref 0.00–0.07)
Basophils Absolute: 0.1 10*3/uL (ref 0.0–0.1)
Basophils Relative: 0 %
Eosinophils Absolute: 0.2 10*3/uL (ref 0.0–0.5)
Eosinophils Relative: 1 %
HCT: 43.4 % (ref 36.0–46.0)
Hemoglobin: 14.1 g/dL (ref 12.0–15.0)
Immature Granulocytes: 1 %
Lymphocytes Relative: 39 %
Lymphs Abs: 6.6 10*3/uL — ABNORMAL HIGH (ref 0.7–4.0)
MCH: 29.2 pg (ref 26.0–34.0)
MCHC: 32.5 g/dL (ref 30.0–36.0)
MCV: 89.9 fL (ref 80.0–100.0)
Monocytes Absolute: 0.9 10*3/uL (ref 0.1–1.0)
Monocytes Relative: 5 %
Neutro Abs: 8.9 10*3/uL — ABNORMAL HIGH (ref 1.7–7.7)
Neutrophils Relative %: 54 %
Platelets: 368 10*3/uL (ref 150–400)
RBC: 4.83 MIL/uL (ref 3.87–5.11)
RDW: 12.5 % (ref 11.5–15.5)
WBC: 16.8 10*3/uL — ABNORMAL HIGH (ref 4.0–10.5)
nRBC: 0 % (ref 0.0–0.2)

## 2020-12-11 LAB — COMPREHENSIVE METABOLIC PANEL
ALT: 18 U/L (ref 0–44)
AST: 14 U/L — ABNORMAL LOW (ref 15–41)
Albumin: 4.2 g/dL (ref 3.5–5.0)
Alkaline Phosphatase: 60 U/L (ref 38–126)
Anion gap: 11 (ref 5–15)
BUN: 12 mg/dL (ref 6–20)
CO2: 22 mmol/L (ref 22–32)
Calcium: 9.1 mg/dL (ref 8.9–10.3)
Chloride: 107 mmol/L (ref 98–111)
Creatinine, Ser: 0.58 mg/dL (ref 0.44–1.00)
GFR, Estimated: >60 mL/min (ref >60)
Glucose, Bld: 172 mg/dL — ABNORMAL HIGH (ref 70–99)
Potassium: 3.7 mmol/L (ref 3.5–5.1)
Sodium: 140 mmol/L (ref 135–145)
Total Bilirubin: 0.6 mg/dL (ref 0.3–1.2)
Total Protein: 7.2 g/dL (ref 6.5–8.1)

## 2020-12-11 LAB — ETHANOL: Alcohol, Ethyl (B): 266 mg/dL — ABNORMAL HIGH (ref <10)

## 2020-12-11 NOTE — ED Notes (Signed)
Patient states she feels dehydrated and that if something happens she will go to her doctor office. Patient declined to stay even though she was strongly advised

## 2021-01-07 ENCOUNTER — Other Ambulatory Visit: Payer: Self-pay | Admitting: Obstetrics and Gynecology

## 2021-01-07 DIAGNOSIS — Z1231 Encounter for screening mammogram for malignant neoplasm of breast: Secondary | ICD-10-CM

## 2021-02-25 ENCOUNTER — Ambulatory Visit: Payer: Medicaid Other

## 2021-03-25 ENCOUNTER — Other Ambulatory Visit: Payer: Self-pay | Admitting: Obstetrics and Gynecology

## 2021-03-25 DIAGNOSIS — Z1231 Encounter for screening mammogram for malignant neoplasm of breast: Secondary | ICD-10-CM

## 2021-04-08 ENCOUNTER — Ambulatory Visit: Payer: Medicaid Other

## 2021-04-12 ENCOUNTER — Emergency Department (HOSPITAL_COMMUNITY): Payer: Self-pay

## 2021-04-12 ENCOUNTER — Other Ambulatory Visit: Payer: Self-pay

## 2021-04-12 ENCOUNTER — Emergency Department (HOSPITAL_COMMUNITY)
Admission: EM | Admit: 2021-04-12 | Discharge: 2021-04-12 | Disposition: A | Discharge status: Home / Self Care | Payer: Self-pay | Attending: Emergency Medicine | Admitting: Emergency Medicine

## 2021-04-12 ENCOUNTER — Encounter (HOSPITAL_COMMUNITY): Payer: Self-pay

## 2021-04-12 DIAGNOSIS — S8991XA Unspecified injury of right lower leg, initial encounter: Secondary | ICD-10-CM | POA: Insufficient documentation

## 2021-04-12 DIAGNOSIS — R110 Nausea: Secondary | ICD-10-CM | POA: Insufficient documentation

## 2021-04-12 DIAGNOSIS — Z7952 Long term (current) use of systemic steroids: Secondary | ICD-10-CM | POA: Insufficient documentation

## 2021-04-12 DIAGNOSIS — J45909 Unspecified asthma, uncomplicated: Secondary | ICD-10-CM | POA: Insufficient documentation

## 2021-04-12 DIAGNOSIS — Z87891 Personal history of nicotine dependence: Secondary | ICD-10-CM | POA: Insufficient documentation

## 2021-04-12 DIAGNOSIS — W130XXA Fall from, out of or through balcony, initial encounter: Secondary | ICD-10-CM | POA: Insufficient documentation

## 2021-04-12 DIAGNOSIS — L03115 Cellulitis of right lower limb: Secondary | ICD-10-CM | POA: Insufficient documentation

## 2021-04-12 DIAGNOSIS — M79604 Pain in right leg: Secondary | ICD-10-CM

## 2021-04-12 MED ORDER — DOXYCYCLINE HYCLATE 100 MG PO CAPS: 100.0000 mg | ORAL_CAPSULE | Freq: Two times a day (BID) | ORAL | 0 refills | Status: DC

## 2021-04-12 NOTE — ED Provider Notes (Signed)
Emergency Medicine Provider Triage Evaluation Note  Kathryn Moon , a 50 y.o. female  was evaluated in triage.  Pt complains of falling through the porch 1 week ago on Sunday.  Patient reports she has had significant pain, redness and swelling of her right leg over the last 2 days.  Patient reports she has not had a fever at home, denies shortness of breath, denies numbness or tingling in her foot.  Review of Systems  Positive: Wound, leg swelling, redness Negative: Shortness of breath  Physical Exam  BP (!) 156/87 (BP Location: Left Arm)   Pulse 80   Temp 99.1 F (37.3 C) (Oral)   Resp 18   Ht 5\' 1"  (1.549 m)   Wt 102.5 kg   SpO2 98%   BMI 42.70 kg/m  Gen:   Awake, no distress   Resp:  Normal effort  MSK:   Moves extremities without difficulty  Other:  Large excoriation with surrounding redness without purulence of the right leg, patient is able to walk without difficulty, with some pain  Medical Decision Making  Medically screening exam initiated at 6:09 PM.  Appropriate orders placed.  was informed that the remainder of the evaluation will be completed by another provider, this initial triage assessment does not replace that evaluation, and the importance of remaining in the ED until their evaluation is complete.  Right leg pain, redness, swelling   Bing Neighbors, PA-C 04/12/21 1811    04/14/21, MD 04/12/21 2306

## 2021-04-12 NOTE — ED Triage Notes (Addendum)
Patient states she fell through an outside porch 8 days ago. Patient has a laceration that is red, bruising to the right ankle and heel. Patient states she has tenderness and swelling to the left hip.  Patient added that she had emesis this AM because her "right leg hurts so bad."

## 2021-04-12 NOTE — Discharge Instructions (Signed)
We discussed your x-ray does not show any evidence of a break at this time.  The redness, swelling, tenderness of your leg does make me worried that you have some cellulitis going on.  I will prescribe an antibiotic for you to take for the next week, please take the entire course.  Please use Tylenol or ibuprofen for pain.  You may use 600 mg ibuprofen every 6 hours or 1000 mg of Tylenol every 6 hours.  You may choose to alternate between the 2.  This would be most effective.  Not to exceed 4 g of Tylenol within 24 hours.  Not to exceed 3200 mg ibuprofen 24 hours. Take your at home nausea medication as needed.

## 2021-04-12 NOTE — ED Provider Notes (Signed)
[facility] [place] [facility]ENC[facility]PT Provider Note   CSN: [id number] Arrival date & time: 04/12/21  1704     History Chief Complaint  Patient presents with   Fall   Leg Injury    Kathryn Moon is a 50 y.o. female  Pt complains of falling through the porch 1 week ago on Sunday.  Patient reports she has had significant pain, redness and swelling of her right leg over the last 2 days.  Patient reports she has not had a fever at home, denies shortness of breath, denies numbness or tingling in her foot. Patient has taken ibuprofen for pain with some relief. Patient endorses some nausea at this time but reports she has not eaten anything all day.   Fall      Past Medical History:  Diagnosis Date   Acne    on bactrim   Anxiety    Asthma    Fibromyalgia     There are no problems to display for this patient.   Past Surgical History:  Procedure Laterality Date   TUBAL LIGATION       OB History     Gravida  4   Para  3   Term  3   Preterm  0   AB  1   Living  3      SAB  0   IAB  1   Ectopic  0   Multiple  0   Live Births              Family History  Problem Relation Age of Onset   Healthy Father    Anesthesia problems Neg Hx     Social History   Tobacco Use   Smoking status: Former    Types: Cigarettes   Smokeless tobacco: Never  Vaping Use   Vaping Use: Never used  Substance Use Topics   Alcohol use: Yes   Drug use: Not Currently    Home Medications Prior to Admission medications   Medication Sig Start Date End Date Taking? Authorizing Provider  doxycycline (VIBRAMYCIN) 100 MG capsule Take 1 capsule (100 mg total) by mouth 2 (two) times daily. 04/12/21  Yes Ananiah Maciolek H, PA-C  albuterol (ACCUNEB) 0.63 MG/3ML nebulizer solution Take 1 ampule by nebulization every 6 (six) hours as needed for wheezing or shortness of breath.    [provider]  ALPRAZolam Prudy Feeler) 0.5 MG tablet Take 0.25-0.5 mg by mouth  daily as needed for anxiety or sleep. Takes for anxiety     [provider]  B Complex Vitamins (VITAMIN B COMPLEX PO) Take 1 tablet by mouth daily.    [provider]  cholecalciferol (VITAMIN D3) 25 MCG (1000 UNIT) tablet Take 1,000 Units by mouth daily.    [provider]  famotidine (PEPCID) 20 MG tablet Take 1 tablet (20 mg total) by mouth 2 (two) times daily. 02/06/20   Henderly, Britni A, PA-C  fluticasone (FLONASE) 50 MCG/ACT nasal spray Place 1 spray into both nostrils daily.     [provider]  hydrOXYzine (ATARAX/VISTARIL) 25 MG tablet Take 1 tablet (25 mg total) by mouth every 8 (eight) hours as needed for anxiety. 07/03/20   Merrilee Jansky, MD  loratadine (CLARITIN) 10 MG tablet Take 10 mg by mouth daily as needed for allergies.    [provider]  ondansetron (ZOFRAN) 4 MG tablet Take 1 tablet (4 mg total) by mouth every 8 (eight) hours as needed for nausea or vomiting.  02/06/20   Henderly, Britni A, PA-C  Pedialyte (PEDIALYTE) SOLN Take 240 mLs by mouth daily.    [provider]  promethazine (PHENERGAN) 25 MG tablet Take 25 mg by mouth every 4 (four) hours as needed for nausea/vomiting. 12/20/18   [provider]  promethazine-dextromethorphan (PROMETHAZINE-DM) 6.25-15 MG/5ML syrup Take 2.5 mLs by mouth 4 (four) times daily as needed for cough. 02/06/20   Henderly, Britni A, PA-C  sucralfate (CARAFATE) 1 GM/10ML suspension Take 10 mLs (1 g total) by mouth 4 (four) times daily -  with meals and at bedtime. 02/06/20   Henderly, Britni A, PA-C  prochlorperazine (COMPAZINE) 10 MG tablet Take 1 tablet (10 mg total) by mouth 2 (two) times daily as needed (headache). Patient not taking: Reported on 05/07/2019 09/18/18 01/24/20  Shaune Pollack, MD    Allergies    Other  Review of Systems   Review of Systems  Skin:  Positive for color change and wound.  All other systems reviewed and are negative.  Physical Exam Updated Vital  Signs BP (!) 148/81 (BP Location: Right Arm)   Pulse 75   Temp 98.9 F (37.2 C) (Oral)   Resp 16   Ht 5\' 1"  (1.549 m)   Wt 102.5 kg   SpO2 100%   BMI 42.70 kg/m   Physical Exam Vitals and nursing note reviewed.  Constitutional:      General: She is not in acute distress.    Appearance: Normal appearance.  HENT:     Head: Normocephalic and atraumatic.  Eyes:     General:        Right eye: No discharge.        Left eye: No discharge.  Cardiovascular:     Rate and Rhythm: Normal rate and regular rhythm.     Comments: DP/PT pulses 2+ and intact on right side Pulmonary:     Effort: Pulmonary effort is normal. No respiratory distress.  Musculoskeletal:        General: No deformity.  Skin:    General: Skin is warm and dry.     Capillary Refill: Capillary refill takes less than 2 seconds.     Comments: Large approximately 3 to 4 cm excoriation on the anterior surface of the right shin, with well-healed eschar.  There is redness, swelling without area of fluctuance surrounding the wound.  There is no purulent discharge  Neurological:     Mental Status: She is alert and oriented to person, place, and time.  Psychiatric:        Mood and Affect: Mood normal.        Behavior: Behavior normal.    ED Results / Procedures / Treatments   Labs (all labs ordered are listed, but only abnormal results are displayed) Labs Reviewed - No data to display  EKG None  Radiology DG Tibia/Fibula Right  Result Date: 04/12/2021 CLINICAL DATA:  Status post trauma. EXAM: RIGHT TIBIA AND FIBULA - 2 VIEW COMPARISON:  None. FINDINGS: There is no evidence of fracture or other focal bone lesions. Very mild soft tissue swelling is seen along the anterior aspect of the proximal right tibia. IMPRESSION: No acute osseous abnormality. Electronically Signed   By: 04/14/2021 M.D.   On: 04/12/2021 19:06    Procedures Procedures   Medications Ordered in ED Medications - No data to display  ED  Course  I have reviewed the triage vital signs and the nursing notes.  Pertinent labs & imaging results that were available during  my care of the patient were reviewed by me and considered in my medical decision making (see chart for details).    MDM Rules/Calculators/A&P                         Radiographic imaging without evidence of acute fracture of the right leg.  Otherwise neurovascularly intact for entire right leg.  Patient moves extremities without difficulty.  Tenderness to palpation on the anterior aspect of the right leg.  There is evidence of redness, swelling, heat surrounding large excoriation on the right leg.  No evidence of fluctuance, no evidence of abscess.  We will prescribe short course of antibiotics, to treat potential cellulitis.  Do not favor abscess at this time.  Patient is neurovascularly intact at this time.  Recommend ibuprofen, Tylenol as needed for pain.  Return precautions given, patient discharged in stable condition. Final Clinical Impression(s) / ED Diagnoses Final diagnoses:  Right leg pain  Cellulitis of right lower extremity    Rx / DC Orders ED Discharge Orders          Ordered    doxycycline (VIBRAMYCIN) 100 MG capsule  2 times daily        04/12/21 2046             Camren Lipsett, Harrel Carina, PA-C 04/12/21 2102    Charlynne Pander, MD 04/12/21 [phone]

## 2021-05-27 ENCOUNTER — Ambulatory Visit
Admission: RE | Admit: 2021-05-27 | Discharge: 2021-05-27 | Disposition: A | Discharge status: Home / Self Care | Payer: No Typology Code available for payment source | Source: Ambulatory Visit | Attending: Obstetrics and Gynecology | Admitting: Obstetrics and Gynecology

## 2021-05-27 ENCOUNTER — Other Ambulatory Visit: Payer: Self-pay

## 2021-05-27 ENCOUNTER — Ambulatory Visit: Payer: Self-pay | Admitting: *Deleted

## 2021-05-27 VITALS — BP 140/88 | Wt 232.4 lb

## 2021-05-27 DIAGNOSIS — Z1231 Encounter for screening mammogram for malignant neoplasm of breast: Secondary | ICD-10-CM

## 2021-05-27 DIAGNOSIS — Z01419 Encounter for gynecological examination (general) (routine) without abnormal findings: Secondary | ICD-10-CM

## 2021-05-27 NOTE — Progress Notes (Signed)
[name] is a 50 y.o. [phone] female who presents to [facility] clinic today with no complaints.    Pap Smear: Pap smear completed today. Last Pap smear was 08/14/2017 at [place] at [facility] clinic and was normal. Per patient has no history of an abnormal Pap smear. Last Pap smear result is available in Epic.   Physical exam: Breasts Breasts symmetrical. No skin abnormalities bilateral breasts. No nipple retraction bilateral breasts. No nipple discharge bilateral breasts. No lymphadenopathy. No lumps palpated bilateral breasts. No complaints of pain or tenderness on exam.  MS DIGITAL SCREENING BILATERAL  Result Date: 08/09/2017 CLINICAL DATA:  Screening. EXAM: DIGITAL SCREENING BILATERAL MAMMOGRAM WITH CAD COMPARISON:  Previous exam(s). ACR Breast Density Category b: There are scattered areas of fibroglandular density. FINDINGS: There are no findings suspicious for malignancy. Images were processed with CAD. IMPRESSION: No mammographic evidence of malignancy. A result letter of this screening mammogram will be mailed directly to the patient. RECOMMENDATION: Screening mammogram in one year. (Code:SM-B-01Y) BI-RADS CATEGORY  1: Negative. Electronically Signed   By: Ted Mcalpine M.D.   On: 08/09/2017 16:15        Pelvic/Bimanual Ext Genitalia No lesions, no swelling and no discharge observed on external genitalia.        Vagina Vagina pink and normal texture. No lesions or discharge observed in vagina.        Cervix Cervix is present. Cervix pink and of normal texture. No discharge observed.    Uterus Uterus is present and palpable. Uterus in normal position and normal size.        Adnexae Bilateral ovaries present and palpable. No tenderness on palpation.         Rectovaginal No rectal exam completed today since patient had no rectal complaints. No skin abnormalities observed on exam.     Smoking History: Patient is a former smoker that quite 3 years ago.   Patient  Navigation: Patient education provided. Access to services provided for patient through [facility] program.   Colorectal Cancer Screening: Per patient has had colonoscopy completed on 1/31/017 at [facility]. No complaints today.    Breast and Cervical Cancer Risk Assessment: Patient does not have family history of breast cancer, known genetic mutations, or radiation treatment to the chest before age 74. Patient does not have history of cervical dysplasia, immunocompromised, or DES exposure in-utero.  Risk Assessment     Risk Scores       05/27/2021   Last edited by: Narda Rutherford, LPN   5-year risk: 0.8 %   Lifetime risk: 7.5 %            A: [facility] exam with pap smear No complaints.  P: Referred patient to the [facility] for a screening mammogram. Appointment scheduled Thursday, May 27, 2021 at 1440.  Priscille Heidelberg, RN 05/27/2021 1:54 PM

## 2021-05-27 NOTE — Patient Instructions (Signed)
Explained breast self awareness with Bing Neighbors. Pap smear completed today. Let her know [facility] will cover Pap smears and HPV every 5 years unless has a history of abnormal Pap smears. Referred patient to the [facility] for a screening mammogram. Appointment scheduled Thursday, May 27, 2021 at 1440. Patient escorted to the mobile unit following [facility] appointment for her screening mammogram. Let patient know will follow up with her within the next couple weeks with results of her Pap smear by phone. Informed patient that the Breast Center will follow up with her within the next couple of weeks with results of her mammogram by letter or phone. Bing Neighbors verbalized understanding.  Jimi Schappert, Kathaleen Maser, RN 1:54 PM

## 2021-05-31 LAB — CYTOLOGY - PAP
Comment: NEGATIVE
Diagnosis: NEGATIVE
High risk HPV: NEGATIVE

## 2021-06-01 ENCOUNTER — Telehealth: Payer: Self-pay

## 2021-06-01 NOTE — Telephone Encounter (Signed)
Patient informed negative Pap/HPV-results, next pap smear due in 5 years. Patient verbalized understanding.  

## 2021-09-18 ENCOUNTER — Encounter (HOSPITAL_COMMUNITY): Payer: Self-pay

## 2021-09-18 ENCOUNTER — Other Ambulatory Visit: Payer: Self-pay

## 2021-09-18 ENCOUNTER — Ambulatory Visit (HOSPITAL_COMMUNITY)
Admission: EM | Admit: 2021-09-18 | Discharge: 2021-09-18 | Disposition: A | Discharge status: Home / Self Care | Payer: No Typology Code available for payment source | Attending: Nurse Practitioner | Admitting: Nurse Practitioner

## 2021-09-18 DIAGNOSIS — J029 Acute pharyngitis, unspecified: Secondary | ICD-10-CM

## 2021-09-18 DIAGNOSIS — L308 Other specified dermatitis: Secondary | ICD-10-CM

## 2021-09-18 DIAGNOSIS — H6692 Otitis media, unspecified, left ear: Secondary | ICD-10-CM

## 2021-09-18 LAB — POCT INFECTIOUS MONO SCREEN, ED / UC: Mono Screen: NEGATIVE

## 2021-09-18 LAB — POCT RAPID STREP A, ED / UC: Streptococcus, Group A Screen (Direct): NEGATIVE

## 2021-09-18 MED ORDER — NYSTATIN 100000 UNIT/GM EX POWD: 1.0000 "application " | Freq: Three times a day (TID) | CUTANEOUS | 0 refills | Status: AC

## 2021-09-18 MED ORDER — AMOXICILLIN 875 MG PO TABS: 875.0000 mg | ORAL_TABLET | Freq: Two times a day (BID) | ORAL | 0 refills | Status: DC

## 2021-09-18 NOTE — ED Triage Notes (Signed)
Pt presents for sore throat and ear pain x 4 days.  She is taken OTC medication to help with symptoms.

## 2021-09-18 NOTE — ED Provider Notes (Signed)
[facility]    CSN: [id number] Arrival date & time: 09/18/21  1256      History   Chief Complaint Chief Complaint  Patient presents with   Ear Pain   Sore Throat    HPI Kathryn Moon is a 51 y.o. female.   Patient reports 4-day history of sore throat and fatigue.  She also reports chronic bilateral ear infections and has noticed increased ear drainage and ear pain.  Denies cough, shortness of breath, wheezing, chest pain, congestion, runny nose, postnasal drip, sneezing, swollen glands, headache, nausea, vomiting, or diarrhea, and change in appetite.  She denies any new rash.  She takes Singulair and Flonase daily.  She has tried Tylenol for the pain with relief, however still concerned because the sore throat has not gone away.  Patient also reports rash under left breast; states her sister told her it was yeast.  No new muscle pain or joint aches.    Past Medical History:  Diagnosis Date   Acne    on bactrim   Anxiety    Asthma    Fibromyalgia     There are no problems to display for this patient.   Past Surgical History:  Procedure Laterality Date   TUBAL LIGATION      OB History     Gravida  4   Para  3   Term  3   Preterm  0   AB  1   Living  3      SAB  0   IAB  1   Ectopic  0   Multiple  0   Live Births               Home Medications    Prior to Admission medications   Medication Sig Start Date End Date Taking? Authorizing Provider  amoxicillin (AMOXIL) 875 MG tablet Take 1 tablet (875 mg total) by mouth 2 (two) times daily. 09/18/21  Yes Cathlean Marseilles A, NP  nystatin (MYCOSTATIN/NYSTOP) powder Apply 1 application topically 3 (three) times daily. 09/18/21  Yes Valentino Nose, NP  albuterol (ACCUNEB) 0.63 MG/3ML nebulizer solution Take 1 ampule by nebulization every 6 (six) hours as needed for wheezing or shortness of breath.    [provider]  ALPRAZolam Prudy Feeler) 0.5 MG tablet Take 0.25-0.5 mg by  mouth daily as needed for anxiety or sleep. Takes for anxiety     [provider]  B Complex Vitamins (VITAMIN B COMPLEX PO) Take 1 tablet by mouth daily.    [provider]  cholecalciferol (VITAMIN D3) 25 MCG (1000 UNIT) tablet Take 1,000 Units by mouth daily.    [provider]  famotidine (PEPCID) 20 MG tablet Take 1 tablet (20 mg total) by mouth 2 (two) times daily. 02/06/20   Henderly, Britni A, PA-C  fluticasone (FLONASE) 50 MCG/ACT nasal spray Place 1 spray into both nostrils daily.     [provider]  hydrOXYzine (ATARAX/VISTARIL) 25 MG tablet Take 1 tablet (25 mg total) by mouth every 8 (eight) hours as needed for anxiety. 07/03/20   Merrilee Jansky, MD  loratadine (CLARITIN) 10 MG tablet Take 10 mg by mouth daily as needed for allergies.    [provider]  ondansetron (ZOFRAN) 4 MG tablet Take 1 tablet (4 mg total) by mouth every 8 (eight) hours as needed for nausea or vomiting. 02/06/20   Henderly, Britni A, PA-C  Pedialyte (PEDIALYTE) SOLN Take 240 mLs by mouth daily.  [provider]  promethazine (PHENERGAN) 25 MG tablet Take 25 mg by mouth every 4 (four) hours as needed for nausea/vomiting. 12/20/18   [provider]  promethazine-dextromethorphan (PROMETHAZINE-DM) 6.25-15 MG/5ML syrup Take 2.5 mLs by mouth 4 (four) times daily as needed for cough. 02/06/20   Henderly, Britni A, PA-C  sucralfate (CARAFATE) 1 GM/10ML suspension Take 10 mLs (1 g total) by mouth 4 (four) times daily -  with meals and at bedtime. 02/06/20   Henderly, Britni A, PA-C  prochlorperazine (COMPAZINE) 10 MG tablet Take 1 tablet (10 mg total) by mouth 2 (two) times daily as needed (headache). Patient not taking: Reported on 05/07/2019 09/18/18 01/24/20  Shaune Pollack, MD    Family History Family History  Problem Relation Age of Onset   Healthy Father    Anesthesia problems Neg Hx     Social History Social History   Tobacco Use   Smoking  status: Former    Types: Cigarettes   Smokeless tobacco: Never  Vaping Use   Vaping Use: Never used  Substance Use Topics   Alcohol use: Yes   Drug use: Not Currently     Allergies   Other   Review of Systems Review of Systems Per HPI  Physical Exam Triage Vital Signs ED Triage Vitals [09/18/21 1453]  Enc Vitals Group     BP (!) 149/94     Pulse Rate 85     Resp 16     Temp 98.2 F (36.8 C)     Temp Source Oral     SpO2 100 %     Weight      Height      Head Circumference      Peak Flow      Pain Score      Pain Loc      Pain Edu?      Excl. in GC?    No data found.  Updated Vital Signs BP (!) 149/94 (BP Location: Left Arm)    Pulse 85    Temp 98.2 F (36.8 C) (Oral)    Resp 16    LMP 01/20/2020 (Exact Date)    SpO2 100%   Visual Acuity Right Eye Distance:   Left Eye Distance:   Bilateral Distance:    Right Eye Near:   Left Eye Near:    Bilateral Near:     Physical Exam Vitals and nursing note reviewed.  Constitutional:      General: She is not in acute distress.    Appearance: She is well-developed. She is obese. She is not ill-appearing or toxic-appearing.  HENT:     Head: Normocephalic and atraumatic.     Right Ear: Ear canal normal.     Ears:     Comments: Right ear chronic-appearing perforated TM; no erythema or drainage Left TM erythematous and perforated; no erythema, drainage, or swelling of external auditory canal    Mouth/Throat:     Mouth: Mucous membranes are moist. No oral lesions.     Pharynx: Uvula midline. Posterior oropharyngeal erythema present. No pharyngeal swelling or oropharyngeal exudate.     Tonsils: No tonsillar exudate.  Cardiovascular:     Rate and Rhythm: Normal rate and regular rhythm.  Pulmonary:     Effort: Pulmonary effort is normal. No respiratory distress.     Breath sounds: Normal breath sounds. No wheezing, rhonchi or rales.  Lymphadenopathy:     Cervical: No cervical adenopathy.  Skin:    General: Skin  is  warm and dry.     Capillary Refill: Capillary refill takes less than 2 seconds.     Coloration: Skin is not pale.     Findings: Rash present. No erythema.          Comments: Moisture-associated skin dermatitis under left breast   Neurological:     Mental Status: She is alert and oriented to person, place, and time.     Motor: No weakness.     Gait: Gait normal.  Psychiatric:        Mood and Affect: Mood normal.        Behavior: Behavior normal.        Thought Content: Thought content normal.        Judgment: Judgment normal.     UC Treatments / Results  Labs (all labs ordered are listed, but only abnormal results are displayed) Labs Reviewed  CULTURE, GROUP A STREP [facility])  POCT INFECTIOUS MONO SCREEN, ED / UC  POCT RAPID STREP A, ED / UC    EKG   Radiology No results found.  Procedures Procedures (including critical care time)  Medications Ordered in UC Medications - No data to display  Initial Impression / Assessment and Plan / UC Course  I have reviewed the triage vital signs and the nursing notes.  Pertinent labs & imaging results that were available during my care of the patient were reviewed by me and considered in my medical decision making (see chart for details).    Examination today is consistent with acute otitis media of left ear.  Treat with amoxicillin 875 mg twice daily for 5 days.  Rapid strep test in office today is negative.  Mononucleosis screen is negative.  Send throat culture off for further testing, if positive, would extend amoxicillin for 5 more days.  Encouraged salt water rinses, Mucinex.  Treat moisture associated skin dermatitis with topical nystatin powder.  Follow-up with primary care if symptoms persist.  If symptoms worsen, go to emergency room. Final Clinical Impressions(s) / UC Diagnoses   Final diagnoses:  Acute otitis media, left  Acute pharyngitis, unspecified etiology  Dermatitis associated with moisture     Discharge  Instructions      Rapid strep test was negative as well as monoscreen.  We will let you know if the throat culture comes back positive and will send in 5 more days of amoxicillin.  Please complete the amoxicillin for the ear infection.  You can use Nystatin powder over the area with the rash.  Follow up with PCP if your symptoms persist without improvement.     ED Prescriptions     Medication Sig Dispense Auth. Provider   amoxicillin (AMOXIL) 875 MG tablet Take 1 tablet (875 mg total) by mouth 2 (two) times daily. 10 tablet Noemi Chapel A, NP   nystatin (MYCOSTATIN/NYSTOP) powder Apply 1 application topically 3 (three) times daily. 15 g Eulogio Bear, NP      PDMP not reviewed this encounter.   Eulogio Bear, NP 09/18/21 [phone]

## 2021-09-18 NOTE — Discharge Instructions (Addendum)
Rapid strep test was negative as well as monoscreen.  We will let you know if the throat culture comes back positive and will send in 5 more days of amoxicillin.  Please complete the amoxicillin for the ear infection.  You can use Nystatin powder over the area with the rash.  Follow up with PCP if your symptoms persist without improvement.

## 2021-09-21 LAB — CULTURE, GROUP A STREP (THRC)

## 2021-10-13 ENCOUNTER — Encounter (HOSPITAL_COMMUNITY): Payer: Self-pay

## 2021-10-13 ENCOUNTER — Ambulatory Visit (HOSPITAL_COMMUNITY)
Admission: EM | Admit: 2021-10-13 | Discharge: 2021-10-13 | Disposition: A | Discharge status: Home / Self Care | Payer: Medicaid Other | Attending: Family Medicine | Admitting: Family Medicine

## 2021-10-13 DIAGNOSIS — H7291 Unspecified perforation of tympanic membrane, right ear: Secondary | ICD-10-CM

## 2021-10-13 DIAGNOSIS — H6983 Other specified disorders of Eustachian tube, bilateral: Secondary | ICD-10-CM

## 2021-10-13 DIAGNOSIS — H6993 Unspecified Eustachian tube disorder, bilateral: Secondary | ICD-10-CM

## 2021-10-13 DIAGNOSIS — H9203 Otalgia, bilateral: Secondary | ICD-10-CM

## 2021-10-13 DIAGNOSIS — J302 Other seasonal allergic rhinitis: Secondary | ICD-10-CM

## 2021-10-13 MED ORDER — PROMETHAZINE-DM 6.25-15 MG/5ML PO SYRP: 5.0000 mL | ORAL_SOLUTION | Freq: Three times a day (TID) | ORAL | 0 refills | Status: AC | PRN

## 2021-10-13 NOTE — ED Triage Notes (Signed)
Last seen on 09/18/21 for sore throat and ear pain tx w/amoxicillin. Pt reports that her ear sxs never resolved. She continues to c/o bilateral ear pain (right >left), ear ringing and pain w/swallowing. Has been using allergy meds and otc pain relief.   ?

## 2021-10-13 NOTE — Discharge Instructions (Signed)
Take 10 mg of the prednisone that you have at home take it daily with food in the morning for total of 5 days.  This will help your inner ear symptoms which I suspect is related to eustachian tube dysfunction.  I have also prescribed you promethazine DM to help with your cough and congestion.  Resume use of your loratadine.  If symptoms worsen or do not resolve recommend follow-up with an ENT. ?

## 2021-10-13 NOTE — ED Provider Notes (Signed)
?[facility] ? ? ? ?CSN: [id number] ?Arrival date & time: 10/13/21  1706 ? ? ?  ? ?History   ?Chief Complaint ?Chief Complaint  ?Patient presents with  ? Otalgia  ?  right  ? ? ?HPI ?Kathryn Neighborsatricia A Cavenaugh is a 51 y.o. female.  ? ?HPI ?Patient patient presents today with a complaint of bilateral ear pain.  Patient was seen here on September 18, 2021 and treated with a course of amoxicillin and also recommended to start allergy medication and take consistently.  She reports other symptoms resolved however she continues to have bilateral ear pain which is exacerbated by swallowing and she complains of ringing in the ear.  Reports lifelong issues with hearing or ear problems however has not seen an ENT as she is uninsured.  Patient reports persistent postnasal drainage and complains of sore throat.  She is afebrile.  Recently completed a course of amoxicillin and is concerned that she may still have an infection in her ears due to severity of pain. ?Past Medical History:  ?Diagnosis Date  ? Acne   ? on bactrim  ? Anxiety   ? Asthma   ? Fibromyalgia   ? ? ?There are no problems to display for this patient. ? ? ?Past Surgical History:  ?Procedure Laterality Date  ? TUBAL LIGATION    ? ? ?OB History   ? ? Gravida  ?4  ? Para  ?3  ? Term  ?3  ? Preterm  ?0  ? AB  ?1  ? Living  ?3  ?  ? ? SAB  ?0  ? IAB  ?1  ? Ectopic  ?0  ? Multiple  ?0  ? Live Births  ?   ?   ?  ?  ? ? ? ?Home Medications   ? ?Prior to Admission medications   ?Medication Sig Start Date End Date Taking? Authorizing Provider  ?albuterol (ACCUNEB) 0.63 MG/3ML nebulizer solution Take 1 ampule by nebulization every 6 (six) hours as needed for wheezing or shortness of breath.    [provider]  ?ALPRAZolam Prudy Feeler(XANAX) 0.5 MG tablet Take 0.25-0.5 mg by mouth daily as needed for anxiety or sleep. Takes for anxiety     [provider]  ?amoxicillin (AMOXIL) 875 MG tablet Take 1 tablet (875 mg total) by mouth 2 (two) times daily. 09/18/21    Valentino NoseMartinez, Jessica A, NP  ?B Complex Vitamins (VITAMIN B COMPLEX PO) Take 1 tablet by mouth daily.    [provider]  ?cholecalciferol (VITAMIN D3) 25 MCG (1000 UNIT) tablet Take 1,000 Units by mouth daily.    [provider]  ?famotidine (PEPCID) 20 MG tablet Take 1 tablet (20 mg total) by mouth 2 (two) times daily. 02/06/20   Henderly, Britni A, PA-C  ?fluticasone (FLONASE) 50 MCG/ACT nasal spray Place 1 spray into both nostrils daily.     [provider]  ?hydrOXYzine (ATARAX/VISTARIL) 25 MG tablet Take 1 tablet (25 mg total) by mouth every 8 (eight) hours as needed for anxiety. 07/03/20   Merrilee JanskyLamptey, Philip O, MD  ?loratadine (CLARITIN) 10 MG tablet Take 10 mg by mouth daily as needed for allergies.    [provider]  ?nystatin (MYCOSTATIN/NYSTOP) powder Apply 1 application topically 3 (three) times daily. 09/18/21   Valentino NoseMartinez, Jessica A, NP  ?ondansetron (ZOFRAN) 4 MG tablet Take 1 tablet (4 mg total) by mouth every 8 (eight) hours as needed for nausea or vomiting. 02/06/20   Henderly, Britni A, PA-C  ?  Pedialyte (PEDIALYTE) SOLN Take 240 mLs by mouth daily.    [provider]  ?promethazine (PHENERGAN) 25 MG tablet Take 25 mg by mouth every 4 (four) hours as needed for nausea/vomiting. 12/20/18   [provider]  ?promethazine-dextromethorphan (PROMETHAZINE-DM) 6.25-15 MG/5ML syrup Take 2.5 mLs by mouth 4 (four) times daily as needed for cough. 02/06/20   Henderly, Britni A, PA-C  ?sucralfate (CARAFATE) 1 GM/10ML suspension Take 10 mLs (1 g total) by mouth 4 (four) times daily -  with meals and at bedtime. 02/06/20   Henderly, Britni A, PA-C  ?prochlorperazine (COMPAZINE) 10 MG tablet Take 1 tablet (10 mg total) by mouth 2 (two) times daily as needed (headache). ?Patient not taking: Reported on 05/07/2019 09/18/18 01/24/20  Shaune Pollack, MD  ? ? ?Family History ?Family History  ?Problem Relation Age of Onset  ? Healthy Father   ? Anesthesia problems Neg Hx    ? ? ?Social History ?Social History  ? ?Tobacco Use  ? Smoking status: Former  ?  Types: Cigarettes  ? Smokeless tobacco: Never  ?Vaping Use  ? Vaping Use: Never used  ?Substance Use Topics  ? Alcohol use: Yes  ? Drug use: Not Currently  ? ? ? ?Allergies   ?Other ? ? ?Review of Systems ?Review of Systems ?Pertinent negatives listed in HPI  ?Physical Exam ?Triage Vital Signs ?ED Triage Vitals  ?Enc Vitals Group  ?   BP 10/13/21 1801 (!) 145/91  ?   Pulse Rate 10/13/21 1801 80  ?   Resp 10/13/21 1801 17  ?   Temp 10/13/21 1801 98 ?F (36.7 ?C)  ?   Temp Source 10/13/21 1801 Oral  ?   SpO2 10/13/21 1801 95 %  ?   Weight --   ?   Height --   ?   Head Circumference --   ?   Peak Flow --   ?   Pain Score 10/13/21 1758 4  ?   Pain Loc --   ?   Pain Edu? --   ?   Excl. in GC? --   ? ?No data found. ? ?Updated Vital Signs ?BP (!) 145/91 (BP Location: Left Arm)   Pulse 80   Temp 98 ?F (36.7 ?C) (Oral)   Resp 17   LMP 01/20/2020 (Exact Date)   SpO2 95%  ? ?Visual Acuity ?Right Eye Distance:   ?Left Eye Distance:   ?Bilateral Distance:   ? ?Right Eye Near:   ?Left Eye Near:    ?Bilateral Near:    ? ?Physical Exam ?Constitutional:   ?   Appearance: She is obese. She is not ill-appearing.  ?HENT:  ?   Head: Normocephalic and atraumatic.  ?   Right Ear: Hearing normal. Tympanic membrane is perforated.  ?   Left Ear: Hearing, tympanic membrane, ear canal and external ear normal. Tympanic membrane is not bulging.  ?Cardiovascular:  ?   Rate and Rhythm: Normal rate and regular rhythm.  ?Pulmonary:  ?   Effort: Pulmonary effort is normal.  ?   Breath sounds: Normal breath sounds.  ?Musculoskeletal:     ?   General: Normal range of motion.  ?Lymphadenopathy:  ?   Cervical: No cervical adenopathy.  ?Skin: ?   Capillary Refill: Capillary refill takes less than 2 seconds.  ?Neurological:  ?   General: No focal deficit present.  ?   Mental Status: She is alert and oriented to person, place, and time.  ?Psychiatric:     ?  Mood and  Affect: Mood normal.  ? ? ? ?UC Treatments / Results  ?Labs ?(all labs ordered are listed, but only abnormal results are displayed) ?Labs Reviewed - No data to display ? ?EKG ? ? ?Radiology ?No results found. ? ?Procedures ?Procedures (including critical care time) ? ?Medications Ordered in UC ?Medications - No data to display ? ?Initial Impression / Assessment and Plan / UC Course  ?I have reviewed the triage vital signs and the nursing notes. ? ?Pertinent labs & imaging results that were available during my care of the patient were reviewed by me and considered in my medical decision making (see chart for details). ? ?  ?Pain related to perforation of the TM involving the right ear in addition to eustachian tube dysfunction.  Continue allergy medication and I have prescribed Promethazine DM to help with cough and congestion.  Advised to follow-up with the ENT if symptoms worsen or do not readily improve.  Advised that discomfort related to perforated TM usually resolves with time. ?Final Clinical Impressions(s) / UC Diagnoses  ? ?Final diagnoses:  ?Otalgia, bilateral  ?Eustachian tube dysfunction, bilateral  ?Seasonal allergies  ?Perforation of right tympanic membrane  ? ?Discharge Instructions   ?None ?  ? ?ED Prescriptions   ? ? Medication Sig Dispense Auth. Provider  ? promethazine-dextromethorphan (PROMETHAZINE-DM) 6.25-15 MG/5ML syrup Take 5 mLs by mouth 3 (three) times daily as needed for cough. 118 mL Kathryn Neighbors, FNP  ? ?  ? ?PDMP not reviewed this encounter. ?  ?Kathryn Neighbors, FNP ?10/18/21 1711 ? ?

## 2022-07-20 DIAGNOSIS — J069 Acute upper respiratory infection, unspecified: Secondary | ICD-10-CM | POA: Diagnosis not present

## 2022-07-20 DIAGNOSIS — H9319 Tinnitus, unspecified ear: Secondary | ICD-10-CM | POA: Diagnosis not present

## 2022-07-20 DIAGNOSIS — R208 Other disturbances of skin sensation: Secondary | ICD-10-CM | POA: Diagnosis not present

## 2022-07-26 DIAGNOSIS — G5603 Carpal tunnel syndrome, bilateral upper limbs: Secondary | ICD-10-CM | POA: Diagnosis not present

## 2022-08-16 ENCOUNTER — Encounter: Payer: Self-pay | Admitting: Orthopaedic Surgery

## 2022-08-16 ENCOUNTER — Ambulatory Visit (INDEPENDENT_AMBULATORY_CARE_PROVIDER_SITE_OTHER): Payer: Medicaid Other | Admitting: Orthopaedic Surgery

## 2022-08-16 DIAGNOSIS — G5603 Carpal tunnel syndrome, bilateral upper limbs: Secondary | ICD-10-CM

## 2022-08-16 DIAGNOSIS — G5602 Carpal tunnel syndrome, left upper limb: Secondary | ICD-10-CM

## 2022-08-16 DIAGNOSIS — G5601 Carpal tunnel syndrome, right upper limb: Secondary | ICD-10-CM | POA: Diagnosis not present

## 2022-08-16 NOTE — Progress Notes (Signed)
Office Visit Note   Patient: Kathryn Moon           Date of Birth: [birth year]           MRN: [id number] Visit Date: 08/16/2022              Requested by: Alroy Dust, L.Marlou Sa, [place] [address] [place],  [place] [id number] PCP: Alroy Dust, L.Marlou Sa, MD   Assessment & Plan: Visit Diagnoses:  1. Right carpal tunnel syndrome   2. Left carpal tunnel syndrome     Plan: Impression is bilateral carpal tunnel syndrome worse on the right.  Disease process reviewed and treatment options were explained.  She will call us when she is ready for surgery but for the meantime she would like to wear braces at night.  Braces were provided today.  Will see her back as needed.  Follow-Up Instructions: No follow-ups on file.   Orders:  No orders of the defined types were placed in this encounter.  No orders of the defined types were placed in this encounter.     Procedures: No procedures performed   Clinical Data: No additional findings.   Subjective: Chief Complaint  Patient presents with   Right Hand - Numbness   Left Hand - Numbness    HPI Kathryn Moon is a 52 year old female here for evaluation of right greater than left carpal tunnel syndrome.  For 3 months she has had numbness and tingling in the thumb index and long fingers.  This wakes her up at night.  Recently had nerve conduction studies at [facility] and per the patient the results showed severe carpal tunnel syndrome in the right hand.  She had previously been diagnosed years ago but this improved after she switched occupations.  Review of Systems  Constitutional: Negative.   HENT: Negative.    Eyes: Negative.   Respiratory: Negative.    Cardiovascular: Negative.   Endocrine: Negative.   Musculoskeletal: Negative.   Neurological: Negative.   Hematological: Negative.   Psychiatric/Behavioral: Negative.    All other systems reviewed and are negative.    Objective: Vital Signs: LMP 01/20/2020 (Exact Date)    Physical Exam Vitals and nursing note reviewed.  Constitutional:      Appearance: She is well-developed.  HENT:     Head: Atraumatic.     Nose: Nose normal.  Eyes:     Extraocular Movements: Extraocular movements intact.  Cardiovascular:     Pulses: Normal pulses.  Pulmonary:     Effort: Pulmonary effort is normal.  Abdominal:     Palpations: Abdomen is soft.  Musculoskeletal:     Cervical back: Neck supple.  Skin:    General: Skin is warm.     Capillary Refill: Capillary refill takes less than 2 seconds.  Neurological:     Mental Status: She is alert. Mental status is at baseline.  Psychiatric:        Behavior: Behavior normal.        Thought Content: Thought content normal.        Judgment: Judgment normal.     Ortho Exam Examination of bilateral hands showed no muscle atrophy.  Positive carpal tunnel compressive signs on the right hand.  She can make a full composite fist. Specialty Comments:  No specialty comments available.  Imaging: No results found.   PMFS History: There are no problems to display for this patient.  Past Medical History:  Diagnosis Date   Acne    on bactrim  Anxiety    Asthma    Fibromyalgia     Family History  Problem Relation Age of Onset   Healthy Father    Anesthesia problems Neg Hx     Past Surgical History:  Procedure Laterality Date   TUBAL LIGATION     Social History   Occupational History   Not on file  Tobacco Use   Smoking status: Former    Types: Cigarettes   Smokeless tobacco: Never  Vaping Use   Vaping Use: Never used  Substance and Sexual Activity   Alcohol use: Yes   Drug use: Not Currently   Sexual activity: Yes    Birth control/protection: Surgical

## 2022-09-12 DIAGNOSIS — H903 Sensorineural hearing loss, bilateral: Secondary | ICD-10-CM | POA: Diagnosis not present

## 2022-09-12 DIAGNOSIS — H9313 Tinnitus, bilateral: Secondary | ICD-10-CM | POA: Diagnosis not present

## 2022-09-26 ENCOUNTER — Telehealth: Payer: Self-pay

## 2022-09-26 NOTE — Telephone Encounter (Signed)
Mychart msg sent

## 2022-09-28 ENCOUNTER — Ambulatory Visit: Payer: Medicaid Other | Admitting: Orthopaedic Surgery

## 2022-09-30 ENCOUNTER — Ambulatory Visit: Payer: Medicaid Other | Admitting: Orthopaedic Surgery

## 2022-10-04 ENCOUNTER — Ambulatory Visit: Payer: Medicaid Other | Admitting: Orthopaedic Surgery

## 2022-10-04 DIAGNOSIS — R202 Paresthesia of skin: Secondary | ICD-10-CM | POA: Diagnosis not present

## 2022-10-04 DIAGNOSIS — H5213 Myopia, bilateral: Secondary | ICD-10-CM | POA: Diagnosis not present

## 2022-10-04 MED ORDER — LIDOCAINE HCL 1 % IJ SOLN
1.0000 mL | INTRAMUSCULAR | Status: AC | PRN
  Administered 2022-10-04: 1 mL

## 2022-10-04 MED ORDER — BUPIVACAINE HCL 0.5 % IJ SOLN
1.0000 mL | INTRAMUSCULAR | Status: AC | PRN
  Administered 2022-10-04: 1 mL

## 2022-10-04 MED ORDER — METHYLPREDNISOLONE ACETATE 40 MG/ML IJ SUSP
40.0000 mg | INTRAMUSCULAR | Status: AC | PRN
  Administered 2022-10-04: 40 mg

## 2022-10-04 NOTE — Progress Notes (Signed)
   Office Visit Note   Patient: Kathryn Moon           Date of Birth: 12-Jan-1971           MRN: [id number] Visit Date: 10/04/2022              Requested by: Alroy Dust, L.Marlou Sa, [place] [address] [place],  [place] [id number] PCP: Alroy Dust, L.Marlou Sa, MD   Assessment & Plan: Visit Diagnoses:  1. Paresthesia of hand, bilateral     Plan: Impression is severe right carpal tunnel syndrome and mild left carpal tunnel syndrome.  Based on these findings I have recommended surgical release of the right hand and continued conservative management for the left hand.  Patient states that she is unable to do surgery at this time and would like to try steroid injections instead.  We will also provide her with carpal tunnel splints to wear at night.  Follow-up as needed.  Total face to face encounter time was greater than 25 minutes and over half of this time was spent in counseling and/or coordination of care.  Follow-Up Instructions: Return if symptoms worsen or fail to improve.   Orders:  No orders of the defined types were placed in this encounter.  No orders of the defined types were placed in this encounter.     Procedures: Hand/UE Inj: bilateral carpal tunnel for carpal tunnel syndrome on 10/04/2022 6:10 PM Indications: pain Details: 25 G needle Medications (Right): 1 mL lidocaine 1 %; 1 mL bupivacaine 0.5 %; 40 mg methylPREDNISolone acetate 40 MG/ML Medications (Left): 1 mL lidocaine 1 %; 1 mL bupivacaine 0.5 %; 40 mg methylPREDNISolone acetate 40 MG/ML Outcome: tolerated well, no immediate complications Patient was prepped and draped in the usual sterile fashion.       Clinical Data: No additional findings.   Subjective: Chief Complaint  Patient presents with   Right Hand - Pain   Left Hand - Pain    HPI  Kathryn Moon returns today for bilateral hand paresthesias.  She underwent nerve conduction studies at [facility] on 07/26/2022.  She reports that she has significant  dysfunction and disability to her hands as a result of the symptoms.  Feels constant weakness in grip strength.  Review of Systems   Objective: Vital Signs: LMP 01/20/2020 (Exact Date)   Physical Exam  Ortho Exam  Bilateral hand exams are unchanged.  Specialty Comments:  No specialty comments available.  Imaging: No results found.   PMFS History: There are no problems to display for this patient.  Past Medical History:  Diagnosis Date   Acne    on bactrim   Anxiety    Asthma    Fibromyalgia     Family History  Problem Relation Age of Onset   Healthy Father    Anesthesia problems Neg Hx     Past Surgical History:  Procedure Laterality Date   TUBAL LIGATION     Social History   Occupational History   Not on file  Tobacco Use   Smoking status: Former    Types: Cigarettes   Smokeless tobacco: Never  Vaping Use   Vaping Use: Never used  Substance and Sexual Activity   Alcohol use: Yes   Drug use: Not Currently   Sexual activity: Yes    Birth control/protection: Surgical

## 2022-10-10 DIAGNOSIS — K625 Hemorrhage of anus and rectum: Secondary | ICD-10-CM | POA: Diagnosis not present

## 2022-10-10 DIAGNOSIS — M797 Fibromyalgia: Secondary | ICD-10-CM | POA: Diagnosis not present

## 2022-10-10 DIAGNOSIS — H9319 Tinnitus, unspecified ear: Secondary | ICD-10-CM | POA: Diagnosis not present

## 2022-10-11 ENCOUNTER — Ambulatory Visit: Payer: Medicaid Other | Admitting: Orthopaedic Surgery

## 2022-12-09 ENCOUNTER — Encounter (HOSPITAL_COMMUNITY): Payer: Self-pay | Admitting: *Deleted

## 2022-12-09 ENCOUNTER — Ambulatory Visit (HOSPITAL_COMMUNITY)
Admission: EM | Admit: 2022-12-09 | Discharge: 2022-12-09 | Disposition: A | Discharge status: Home / Self Care | Payer: Medicaid Other

## 2022-12-09 DIAGNOSIS — H6504 Acute serous otitis media, recurrent, right ear: Secondary | ICD-10-CM

## 2022-12-09 DIAGNOSIS — H60391 Other infective otitis externa, right ear: Secondary | ICD-10-CM

## 2022-12-09 HISTORY — DX: Unspecified eustachian tube disorder, unspecified ear: H69.90

## 2022-12-09 MED ORDER — AMOXICILLIN 875 MG PO TABS: 875.0000 mg | ORAL_TABLET | Freq: Two times a day (BID) | ORAL | 0 refills | Status: DC

## 2022-12-09 MED ORDER — OFLOXACIN 0.3 % OT SOLN: 5.0000 [drp] | Freq: Two times a day (BID) | OTIC | 0 refills | Status: AC

## 2022-12-09 MED ORDER — FLUCONAZOLE 150 MG PO TABS: ORAL_TABLET | ORAL | 0 refills | Status: DC

## 2022-12-09 NOTE — ED Provider Notes (Signed)
[facility]    CSN: [id number] Arrival date & time: 12/09/22  1430      History   Chief Complaint Chief Complaint  Patient presents with   Ear Pain    HPI Kathryn Moon is a 52 y.o. female.   Patient presents to the clinic for ongoing right ear pain for the past 9 days.  Reports she has had sore throat and lymph node swelling, however, the ear pain is the most pressing thing.  She does have chronic ear infections, try to get into ear nose and throat who told her the first appointment availability was in July.  She normally puts eardrops to help dry her ear after showering, she got them off [employer] and they are for swimmer's ear, she forgot to do this while back and thinks this is why she is having the ear infection.  Reports she has had multiple sick contacts at church who have also been diagnosed with ear infections.  She denies fever.  Reports some ear drainage to the right side.  She does clean her ears out frequently with Q-tips.    The history is provided by the patient and medical records.    Past Medical History:  Diagnosis Date   Acne    on bactrim   Anxiety    Asthma    Eustachian tube dysfunction    Fibromyalgia     There are no problems to display for this patient.   Past Surgical History:  Procedure Laterality Date   MYRINGOTOMY WITH TUBE PLACEMENT     TUBAL LIGATION      OB History     Gravida  4   Para  3   Term  3   Preterm  0   AB  1   Living  3      SAB  0   IAB  1   Ectopic  0   Multiple  0   Live Births               Home Medications    Prior to Admission medications   Medication Sig Start Date End Date Taking? Authorizing Provider  ALPRAZolam Prudy Feeler) 0.5 MG tablet Take 0.25-0.5 mg by mouth daily as needed for anxiety or sleep. Takes for anxiety    Yes [provider]  cholecalciferol (VITAMIN D3) 25 MCG (1000 UNIT) tablet Take 1,000 Units by mouth daily.   Yes [provider]   fluconazole (DIFLUCAN) 150 MG tablet Take 1 dose if needed for vaginal itching, another in 72 hours if itching persists. 12/09/22  Yes Rinaldo Ratel, [place] N, FNP  fluticasone [facility]) 50 MCG/ACT nasal spray Place 1 spray into both nostrils daily.    Yes [provider]  IBUPROFEN PO Take by mouth.   Yes [provider]  ofloxacin (FLOXIN) 0.3 % OTIC solution Place 5 drops into the right ear 2 (two) times daily for 7 days. 12/09/22 12/16/22 Yes Rinaldo Ratel, [place] N, FNP  albuterol (ACCUNEB) 0.63 MG/3ML nebulizer solution Take 1 ampule by nebulization every 6 (six) hours as needed for wheezing or shortness of breath.    [provider]  amoxicillin (AMOXIL) 875 MG tablet Take 1 tablet (875 mg total) by mouth 2 (two) times daily. 12/09/22   Adler Chartrand, [place] N, FNP  B Complex Vitamins (VITAMIN B COMPLEX PO) Take 1 tablet by mouth daily.    [provider]  famotidine (PEPCID) 20 MG tablet Take 1 tablet (20 mg total) by mouth  2 (two) times daily. 02/06/20   Henderly, Britni A, PA-C  hydrOXYzine (ATARAX/VISTARIL) 25 MG tablet Take 1 tablet (25 mg total) by mouth every 8 (eight) hours as needed for anxiety. 07/03/20   Merrilee Jansky, MD  loratadine (CLARITIN) 10 MG tablet Take 10 mg by mouth daily as needed for allergies.    [provider]  nystatin (MYCOSTATIN/NYSTOP) powder Apply 1 application topically 3 (three) times daily. 09/18/21   Valentino Nose, NP  ondansetron (ZOFRAN) 4 MG tablet Take 1 tablet (4 mg total) by mouth every 8 (eight) hours as needed for nausea or vomiting. 02/06/20   Henderly, Britni A, PA-C  Pedialyte (PEDIALYTE) SOLN Take 240 mLs by mouth daily.    [provider]  promethazine (PHENERGAN) 25 MG tablet Take 25 mg by mouth every 4 (four) hours as needed for nausea/vomiting. 12/20/18   [provider]  promethazine-dextromethorphan (PROMETHAZINE-DM) 6.25-15 MG/5ML syrup Take 5 mLs by mouth 3 (three) times daily as needed  for cough. 10/13/21   Bing Neighbors, NP  sucralfate (CARAFATE) 1 GM/10ML suspension Take 10 mLs (1 g total) by mouth 4 (four) times daily -  with meals and at bedtime. 02/06/20   Henderly, Britni A, PA-C  prochlorperazine (COMPAZINE) 10 MG tablet Take 1 tablet (10 mg total) by mouth 2 (two) times daily as needed (headache). Patient not taking: Reported on 05/07/2019 09/18/18 01/24/20  Shaune Pollack, MD    Family History Family History  Problem Relation Age of Onset   Healthy Father    Anesthesia problems Neg Hx     Social History Social History   Tobacco Use   Smoking status: Former    Types: Cigarettes   Smokeless tobacco: Never  Vaping Use   Vaping Use: Never used  Substance Use Topics   Alcohol use: Not Currently   Drug use: Not Currently     Allergies   Patient has no known allergies.   Review of Systems Review of Systems  Constitutional:  Negative for chills and fever.  HENT:  Positive for ear discharge, ear pain and sore throat.   Respiratory:  Negative for cough and shortness of breath.   Cardiovascular:  Negative for chest pain.     Physical Exam Triage Vital Signs ED Triage Vitals  Enc Vitals Group     BP 12/09/22 1457 (!) 131/93     Pulse Rate 12/09/22 1457 90     Resp 12/09/22 1457 16     Temp 12/09/22 1457 98 F (36.7 C)     Temp Source 12/09/22 1457 Oral     SpO2 12/09/22 1457 96 %     Weight --      Height --      Head Circumference --      Peak Flow --      Pain Score 12/09/22 1459 7     Pain Loc --      Pain Edu? --      Excl. in GC? --    No data found.  Updated Vital Signs BP (!) 131/93   Pulse 90   Temp 98 F (36.7 C) (Oral)   Resp 16   LMP 01/20/2020 (Exact Date)   SpO2 96%   Visual Acuity Right Eye Distance:   Left Eye Distance:   Bilateral Distance:    Right Eye Near:   Left Eye Near:    Bilateral Near:     Physical Exam Vitals and nursing note reviewed.  Constitutional:  Appearance: Normal appearance.   HENT:     Head: Normocephalic and atraumatic.     Right Ear: Tenderness present. A middle ear effusion is present. Tympanic membrane is erythematous and bulging.     Left Ear: Tympanic membrane, ear canal and external ear normal.     Mouth/Throat:     Mouth: Mucous membranes are moist.     Pharynx: Posterior oropharyngeal erythema present.  Cardiovascular:     Rate and Rhythm: Normal rate.  Pulmonary:     Effort: Pulmonary effort is normal. No respiratory distress.  Lymphadenopathy:     Cervical: Cervical adenopathy present.  Skin:    General: Skin is warm and dry.  Neurological:     General: No focal deficit present.     Mental Status: She is alert and oriented to person, place, and time.  Psychiatric:        Mood and Affect: Mood normal.        Behavior: Behavior normal. Behavior is cooperative.      UC Treatments / Results  Labs (all labs ordered are listed, but only abnormal results are displayed) Labs Reviewed - No data to display  EKG   Radiology No results found.  Procedures Procedures (including critical care time)  Medications Ordered in UC Medications - No data to display  Initial Impression / Assessment and Plan / UC Course  I have reviewed the triage vital signs and the nursing notes.  Pertinent labs & imaging results that were available during my care of the patient were reviewed by me and considered in my medical decision making (see chart for details).  Vitals and triage reviewed, patient is hemodynamically stable.  External ear canal with erythema and scant drainage, tenderness to pinna and auricle manipulation.  Right-sided tympanic membrane is erythematous and bulging.  Will cover with oral amoxicillin and ofloxacin eardrops.  Encouraged to follow-up with ear nose and throat.  Given to fluid can in case yeast develops.  Plan of care, follow-up care, and return precautions discussed, no questions at this time.     Final Clinical Impressions(s) /  UC Diagnoses   Final diagnoses:  Recurrent acute serous otitis media of right ear  Infective otitis externa of right ear   Discharge Instructions   None    ED Prescriptions     Medication Sig Dispense Auth. Provider   amoxicillin (AMOXIL) 875 MG tablet Take 1 tablet (875 mg total) by mouth 2 (two) times daily. 10 tablet Rinaldo Ratel, [place] N, FNP   ofloxacin (FLOXIN) 0.3 % OTIC solution Place 5 drops into the right ear 2 (two) times daily for 7 days. 5 mL Rinaldo Ratel, [place] N, FNP   fluconazole (DIFLUCAN) 150 MG tablet Take 1 dose if needed for vaginal itching, another in 72 hours if itching persists. 2 tablet Trellis Guirguis, [place] N, FNP      PDMP not reviewed this encounter.   Farah Lepak, [place] N, [place] 12/09/22 1515

## 2022-12-09 NOTE — ED Triage Notes (Signed)
C/O right ear pain onset approx 9 days ago. States gets frequent ear infections; called her ENT but could not get an appt until July. States this past week took some old leftover Bactrim, and usually uses some OTC drops, but ran out.

## 2022-12-15 DIAGNOSIS — J309 Allergic rhinitis, unspecified: Secondary | ICD-10-CM | POA: Diagnosis not present

## 2022-12-15 DIAGNOSIS — J029 Acute pharyngitis, unspecified: Secondary | ICD-10-CM | POA: Diagnosis not present

## 2023-02-02 DIAGNOSIS — H60332 Swimmer's ear, left ear: Secondary | ICD-10-CM | POA: Diagnosis not present

## 2023-02-02 DIAGNOSIS — J309 Allergic rhinitis, unspecified: Secondary | ICD-10-CM | POA: Diagnosis not present

## 2023-02-10 DIAGNOSIS — H9202 Otalgia, left ear: Secondary | ICD-10-CM | POA: Diagnosis not present

## 2023-02-20 IMAGING — CR DG TIBIA/FIBULA 2V*R*
2 series · 2 of 2 positions shown · non-contrast
Comparison: None.

CLINICAL DATA: Status post trauma.

EXAM:
RIGHT TIBIA AND FIBULA - 2 VIEW

[x tib-fib ap right]
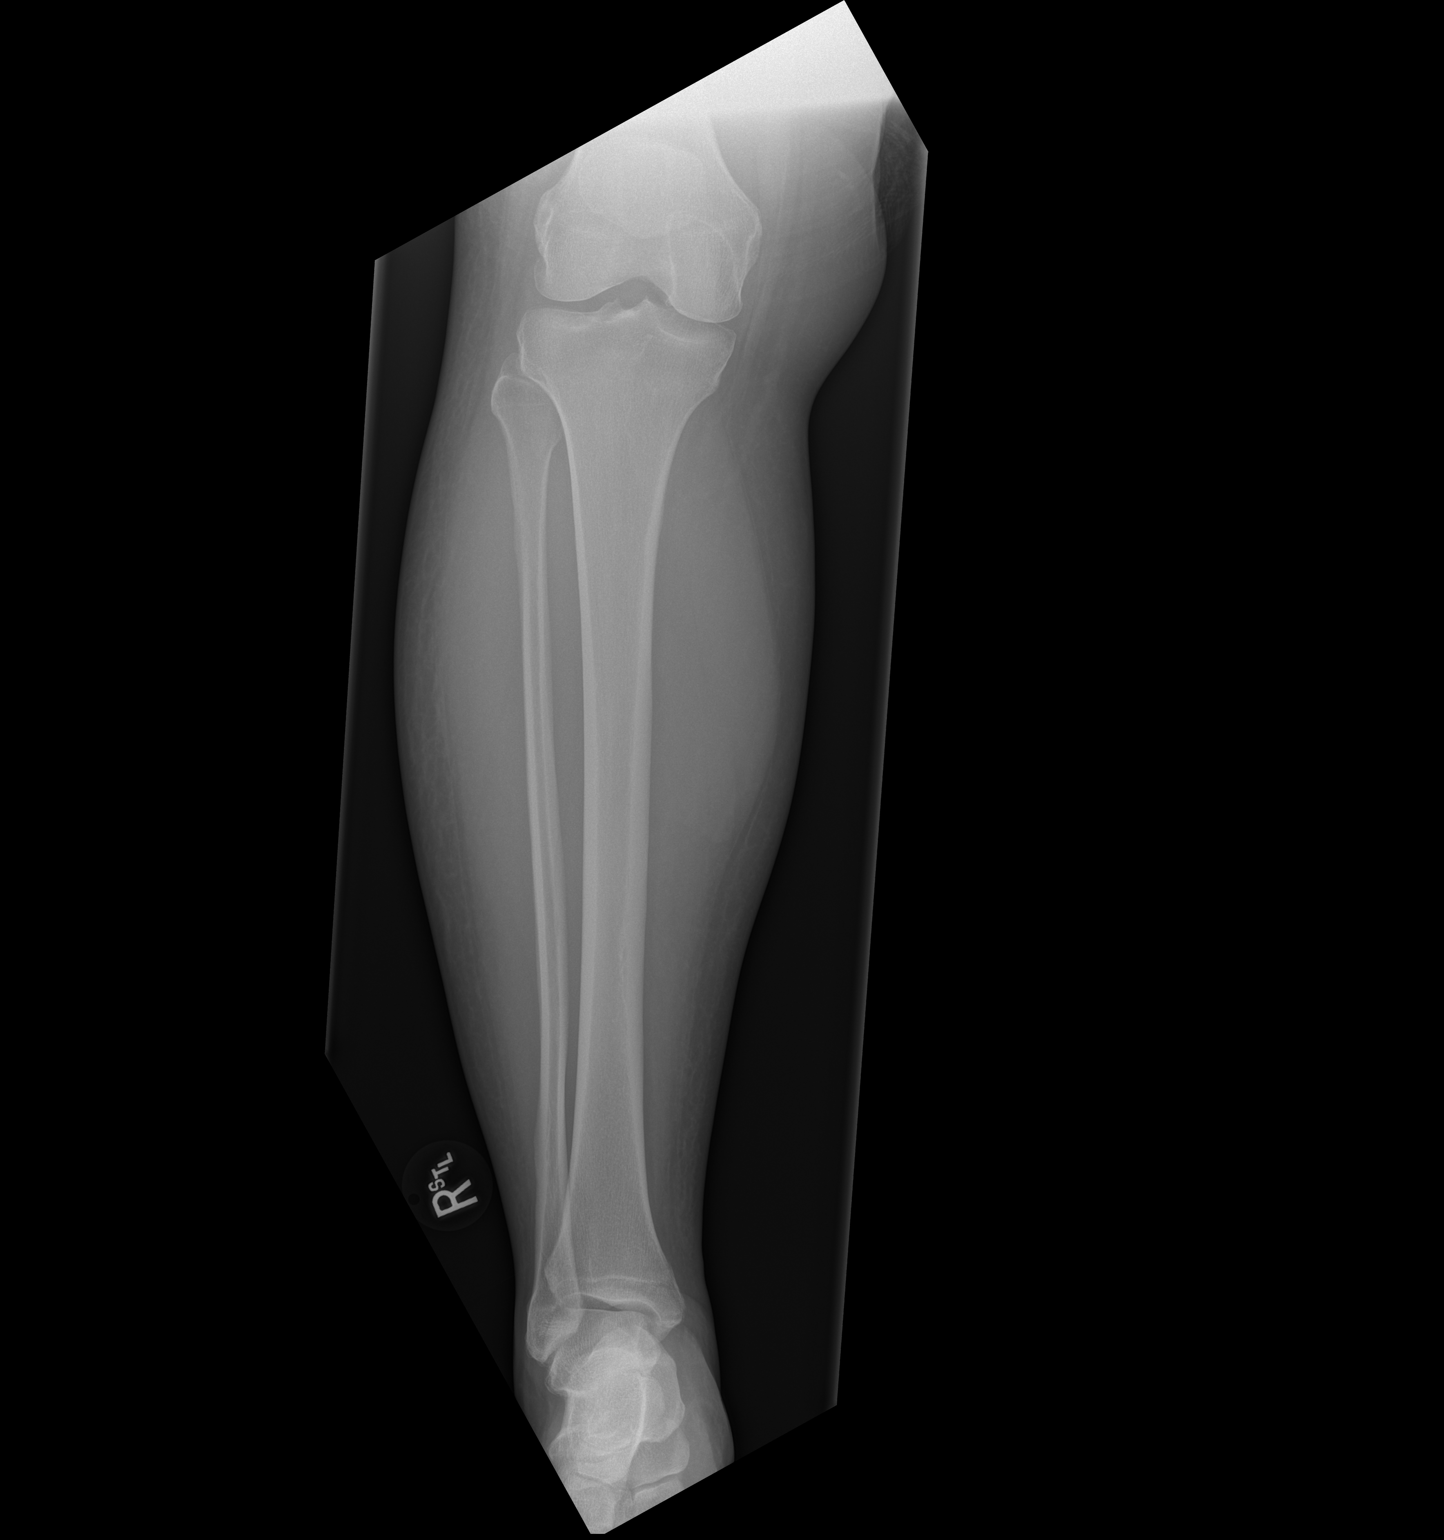

[x tib-fib lat right]
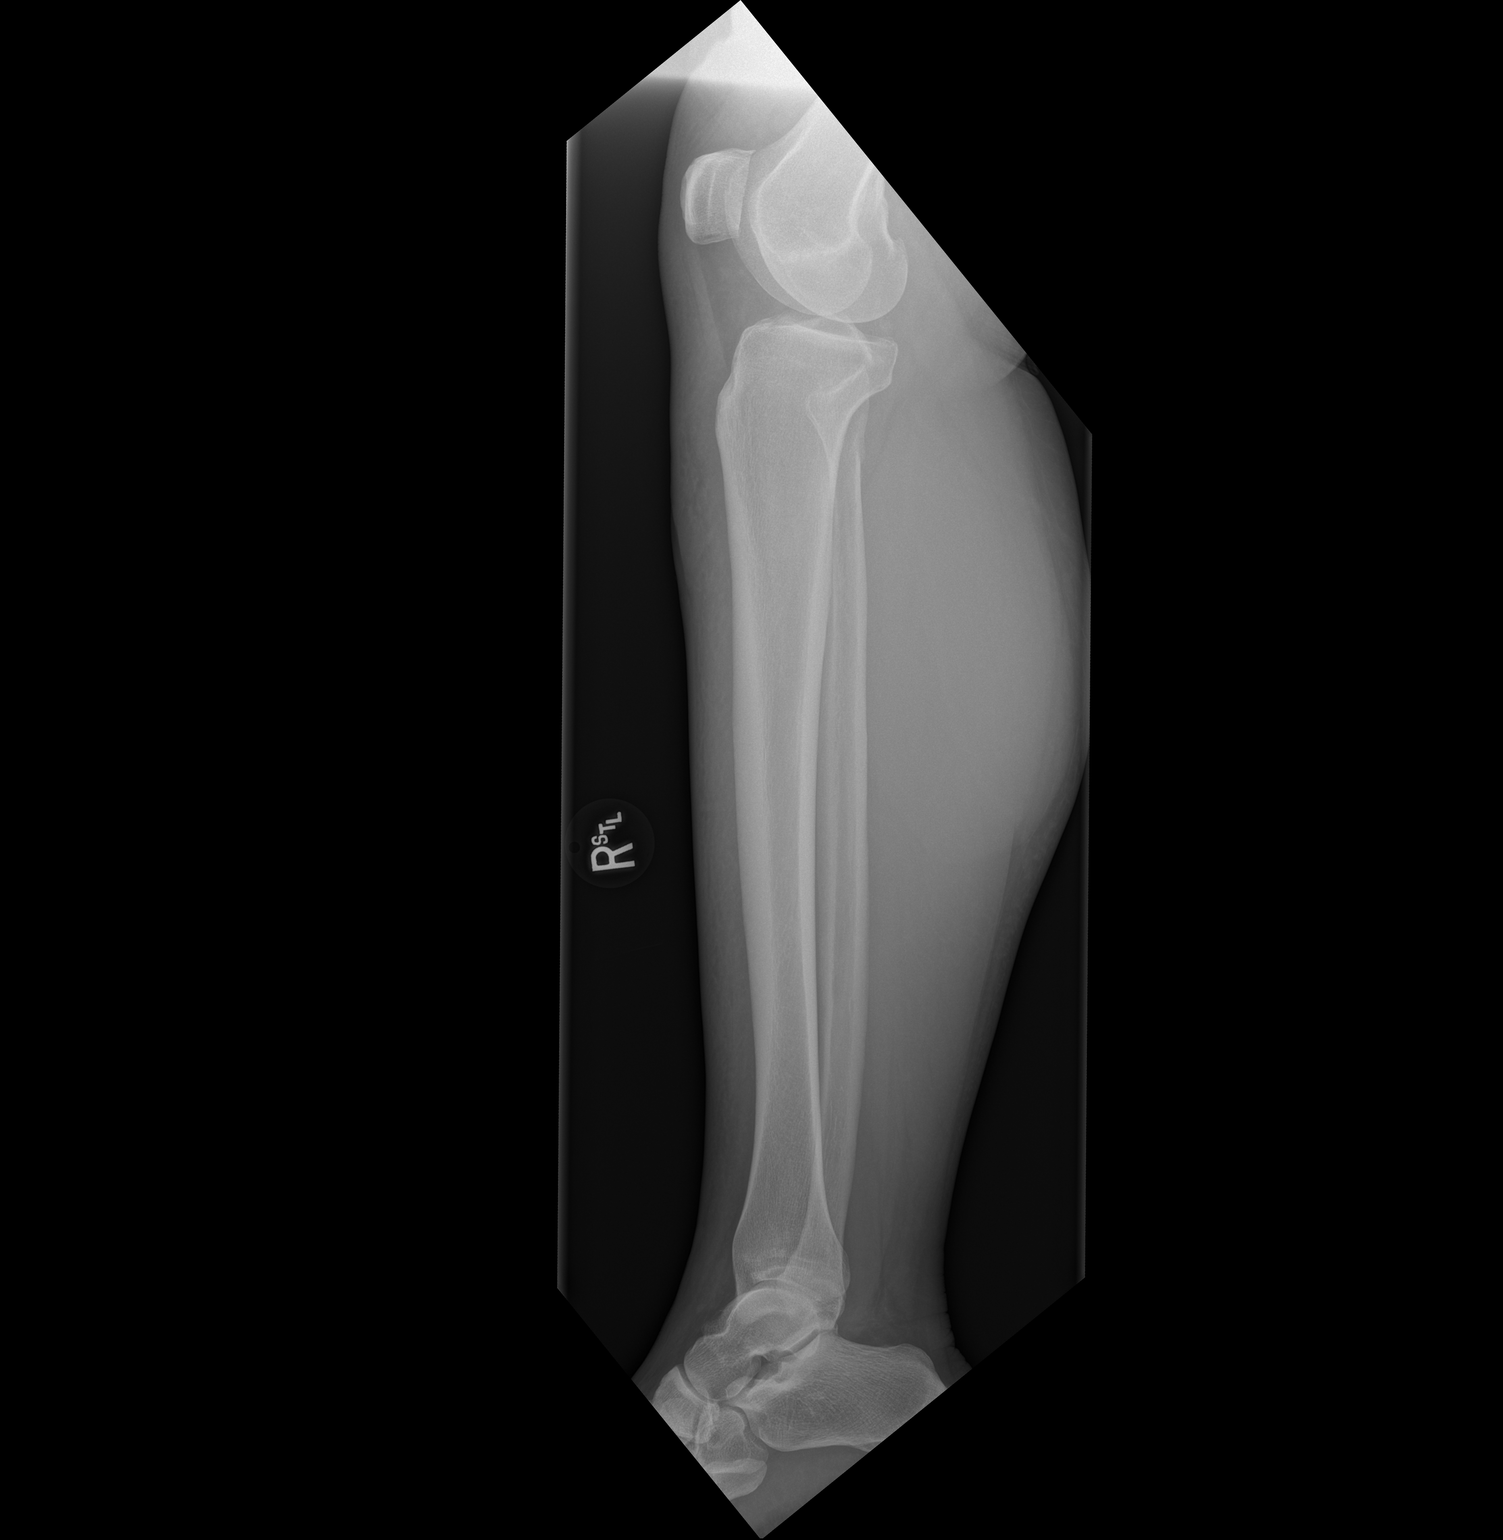

[2 of 2 positions shown; findings below may reference images not displayed]

FINDINGS: There is no evidence of fracture or other focal bone lesions. Very
mild soft tissue swelling is seen along the anterior aspect of the
proximal right tibia.
IMPRESSION: No acute osseous abnormality.

## 2023-03-22 ENCOUNTER — Ambulatory Visit (HOSPITAL_COMMUNITY): Payer: Medicaid Other

## 2023-04-03 ENCOUNTER — Institutional Professional Consult (permissible substitution) (INDEPENDENT_AMBULATORY_CARE_PROVIDER_SITE_OTHER): Payer: Medicaid Other | Admitting: Otolaryngology

## 2023-05-27 DIAGNOSIS — Z23 Encounter for immunization: Secondary | ICD-10-CM | POA: Diagnosis not present

## 2023-06-27 DIAGNOSIS — J45909 Unspecified asthma, uncomplicated: Secondary | ICD-10-CM | POA: Diagnosis not present

## 2023-06-27 DIAGNOSIS — F419 Anxiety disorder, unspecified: Secondary | ICD-10-CM | POA: Diagnosis not present

## 2023-06-27 DIAGNOSIS — M797 Fibromyalgia: Secondary | ICD-10-CM | POA: Diagnosis not present

## 2023-07-05 DIAGNOSIS — Z860102 Personal history of hyperplastic colon polyps: Secondary | ICD-10-CM | POA: Diagnosis not present

## 2023-07-05 DIAGNOSIS — Z800 Family history of malignant neoplasm of digestive organs: Secondary | ICD-10-CM | POA: Diagnosis not present

## 2023-07-05 DIAGNOSIS — K625 Hemorrhage of anus and rectum: Secondary | ICD-10-CM | POA: Diagnosis not present

## 2023-07-13 DIAGNOSIS — Z800 Family history of malignant neoplasm of digestive organs: Secondary | ICD-10-CM | POA: Diagnosis not present

## 2023-07-13 DIAGNOSIS — K625 Hemorrhage of anus and rectum: Secondary | ICD-10-CM | POA: Diagnosis not present

## 2023-07-13 DIAGNOSIS — D123 Benign neoplasm of transverse colon: Secondary | ICD-10-CM | POA: Diagnosis not present

## 2023-07-13 DIAGNOSIS — D125 Benign neoplasm of sigmoid colon: Secondary | ICD-10-CM | POA: Diagnosis not present

## 2023-07-13 DIAGNOSIS — K5730 Diverticulosis of large intestine without perforation or abscess without bleeding: Secondary | ICD-10-CM | POA: Diagnosis not present

## 2023-09-02 ENCOUNTER — Emergency Department (HOSPITAL_BASED_OUTPATIENT_CLINIC_OR_DEPARTMENT_OTHER)
Admission: EM | Admit: 2023-09-02 | Discharge: 2023-09-02 | Disposition: A | Discharge status: Home / Self Care | Payer: Medicaid Other | Attending: Emergency Medicine | Admitting: Emergency Medicine

## 2023-09-02 ENCOUNTER — Other Ambulatory Visit: Payer: Self-pay

## 2023-09-02 DIAGNOSIS — J45909 Unspecified asthma, uncomplicated: Secondary | ICD-10-CM | POA: Insufficient documentation

## 2023-09-02 DIAGNOSIS — H66002 Acute suppurative otitis media without spontaneous rupture of ear drum, left ear: Secondary | ICD-10-CM | POA: Insufficient documentation

## 2023-09-02 DIAGNOSIS — M791 Myalgia, unspecified site: Secondary | ICD-10-CM | POA: Diagnosis not present

## 2023-09-02 DIAGNOSIS — R197 Diarrhea, unspecified: Secondary | ICD-10-CM

## 2023-09-02 DIAGNOSIS — R109 Unspecified abdominal pain: Secondary | ICD-10-CM | POA: Diagnosis present

## 2023-09-02 DIAGNOSIS — R52 Pain, unspecified: Secondary | ICD-10-CM

## 2023-09-02 DIAGNOSIS — B9689 Other specified bacterial agents as the cause of diseases classified elsewhere: Secondary | ICD-10-CM | POA: Insufficient documentation

## 2023-09-02 DIAGNOSIS — Z20822 Contact with and (suspected) exposure to covid-19: Secondary | ICD-10-CM | POA: Diagnosis not present

## 2023-09-02 DIAGNOSIS — N390 Urinary tract infection, site not specified: Secondary | ICD-10-CM | POA: Diagnosis not present

## 2023-09-02 LAB — CBC
HCT: 41.1 % (ref 36.0–46.0)
Hemoglobin: 14.3 g/dL (ref 12.0–15.0)
MCH: 29.4 pg (ref 26.0–34.0)
MCHC: 34.8 g/dL (ref 30.0–36.0)
MCV: 84.4 fL (ref 80.0–100.0)
Platelets: 260 10*3/uL (ref 150–400)
RBC: 4.87 MIL/uL (ref 3.87–5.11)
RDW: 12.5 % (ref 11.5–15.5)
WBC: 10.3 10*3/uL (ref 4.0–10.5)
nRBC: 0 % (ref 0.0–0.2)

## 2023-09-02 LAB — URINALYSIS, ROUTINE W REFLEX MICROSCOPIC
Bilirubin Urine: NEGATIVE
Glucose, UA: NEGATIVE mg/dL
Ketones, ur: 15 mg/dL — AB
Nitrite: POSITIVE — AB
Protein, ur: 30 mg/dL — AB
RBC / HPF: >50 RBC/hpf (ref 0–5)
Specific Gravity, Urine: 1.022 (ref 1.005–1.030)
WBC, UA: >50 WBC/hpf (ref 0–5)
pH: 6.5 (ref 5.0–8.0)

## 2023-09-02 LAB — COMPREHENSIVE METABOLIC PANEL
ALT: 30 U/L (ref 0–44)
AST: 33 U/L (ref 15–41)
Albumin: 4.4 g/dL (ref 3.5–5.0)
Alkaline Phosphatase: 68 U/L (ref 38–126)
Anion gap: 9 (ref 5–15)
BUN: 8 mg/dL (ref 6–20)
CO2: 27 mmol/L (ref 22–32)
Calcium: 8.8 mg/dL — ABNORMAL LOW (ref 8.9–10.3)
Chloride: 103 mmol/L (ref 98–111)
Creatinine, Ser: 0.45 mg/dL (ref 0.44–1.00)
GFR, Estimated: >60 mL/min (ref >60)
Glucose, Bld: 157 mg/dL — ABNORMAL HIGH (ref 70–99)
Potassium: 3.3 mmol/L — ABNORMAL LOW (ref 3.5–5.1)
Sodium: 139 mmol/L (ref 135–145)
Total Bilirubin: 0.6 mg/dL (ref 0.0–1.2)
Total Protein: 6.9 g/dL (ref 6.5–8.1)

## 2023-09-02 LAB — RESP PANEL BY RT-PCR (RSV, FLU A&B, COVID)  RVPGX2
Influenza A by PCR: NEGATIVE
Influenza B by PCR: NEGATIVE
Resp Syncytial Virus by PCR: NEGATIVE
SARS Coronavirus 2 by RT PCR: NEGATIVE

## 2023-09-02 LAB — LIPASE, BLOOD: Lipase: 27 U/L (ref 11–51)

## 2023-09-02 MED ORDER — FLUCONAZOLE 150 MG PO TABS: ORAL_TABLET | ORAL | 0 refills | Status: DC

## 2023-09-02 MED ORDER — POTASSIUM CHLORIDE CRYS ER 20 MEQ PO TBCR
40.0000 meq | EXTENDED_RELEASE_TABLET | Freq: Once | ORAL | Status: AC
  Administered 2023-09-02: 40 meq via ORAL
  Filled 2023-09-02: qty 2

## 2023-09-02 MED ORDER — CEFDINIR 300 MG PO CAPS: 300.0000 mg | ORAL_CAPSULE | Freq: Two times a day (BID) | ORAL | 0 refills | Status: AC

## 2023-09-02 MED ORDER — LACTATED RINGERS IV BOLUS
500.0000 mL | Freq: Once | INTRAVENOUS | Status: AC
  Administered 2023-09-02: 500 mL via INTRAVENOUS

## 2023-09-02 NOTE — ED Provider Notes (Signed)
 [place] EMERGENCY DEPARTMENT AT [facility] Provider Note   CSN: [id number] Arrival date & time: 09/02/23  1750     History  Chief Complaint  Patient presents with   Diarrhea    Kathryn Moon is a 53 y.o. female.  HPI      53yo female with history of anxiety, asthma, acne (intermittent bactrim), fibromyalgia, eustachian tube dysfunction who presents with concern for body aches, diarrhea, ear pain, abdominal discomfort.  Presents with concern for intermittent fevers, fatigue, diarrhea for 10 days.  She is caring for a patient with C. difficile and is worried she has the same.  06-27-2024 client had CDiff (and passed away in 06-27-2024)  Has been having diarrhea 5-11 times/day, with this frequency can't say Can't say how long Fever comes and goes, cheeks red and hot, minimum 99 100 Middle abdominal pain  Tingling with urination starting today  Ear pain all life but recently 10 days, thought caught a cold See Dr. On Tuesday Vaginal symptoms discharge, burning/itching No vomiting today, once yesterday  Pain all over Difficult to say how long everything going on On bactrim intermittently for acne  Past Medical History:  Diagnosis Date   Acne    on bactrim   Anxiety    Asthma    Eustachian tube dysfunction    Fibromyalgia      Home Medications Prior to Admission medications   Medication Sig Start Date End Date Taking? Authorizing Provider  cefdinir  (OMNICEF ) 300 MG capsule Take 1 capsule (300 mg total) by mouth 2 (two) times daily for 7 days. 09/02/23 09/09/23 Yes Dreama Longs, MD  albuterol  (ACCUNEB ) 0.63 MG/3ML nebulizer solution Take 1 ampule by nebulization every 6 (six) hours as needed for wheezing or shortness of breath.    [provider]  ALPRAZolam (XANAX) 0.5 MG tablet Take 0.25-0.5 mg by mouth daily as needed for anxiety or sleep. Takes for anxiety     [provider]  amoxicillin  (AMOXIL ) 875 MG tablet Take 1 tablet (875  mg total) by mouth 2 (two) times daily. 12/09/22   Dreama, [place]  N, FNP  B Complex Vitamins (VITAMIN B COMPLEX PO) Take 1 tablet by mouth daily.    [provider]  cholecalciferol (VITAMIN D3) 25 MCG (1000 UNIT) tablet Take 1,000 Units by mouth daily.    [provider]  famotidine  (PEPCID ) 20 MG tablet Take 1 tablet (20 mg total) by mouth 2 (two) times daily. 02/06/20   Henderly, Britni A, PA-C  fluconazole  (DIFLUCAN ) 150 MG tablet Take 1 dose if needed for vaginal itching, another in 72 hours if itching persists. 09/02/23   Dreama Longs, MD  fluticasone (FLONASE) 50 MCG/ACT nasal spray Place 1 spray into both nostrils daily.     [provider]  hydrOXYzine  (ATARAX /VISTARIL ) 25 MG tablet Take 1 tablet (25 mg total) by mouth every 8 (eight) hours as needed for anxiety. 07/03/20   Blaise Aleene KIDD, MD  IBUPROFEN  PO Take by mouth.    [provider]  loratadine (CLARITIN) 10 MG tablet Take 10 mg by mouth daily as needed for allergies.    [provider]  nystatin  (MYCOSTATIN /NYSTOP ) powder Apply 1 application topically 3 (three) times daily. 09/18/21   Chandra Harlene DELENA, NP  ondansetron  (ZOFRAN ) 4 MG tablet Take 1 tablet (4 mg total) by mouth every 8 (eight) hours as needed for nausea or vomiting. 02/06/20   Henderly, Britni A, PA-C  Pedialyte (PEDIALYTE) SOLN Take 240 mLs by mouth daily.  [provider]  promethazine  (PHENERGAN ) 25 MG tablet Take 25 mg by mouth every 4 (four) hours as needed for nausea/vomiting. 12/20/18   [provider]  promethazine -dextromethorphan (PROMETHAZINE -DM) 6.25-15 MG/5ML syrup Take 5 mLs by mouth 3 (three) times daily as needed for cough. 10/13/21   Arloa Suzen RAMAN, NP  sucralfate  (CARAFATE ) 1 GM/10ML suspension Take 10 mLs (1 g total) by mouth 4 (four) times daily -  with meals and at bedtime. 02/06/20   Henderly, Britni A, PA-C  prochlorperazine  (COMPAZINE ) 10 MG tablet Take 1 tablet (10 mg total)  by mouth 2 (two) times daily as needed (headache). Patient not taking: Reported on 05/07/2019 09/18/18 01/24/20  [name], MD      Allergies    Patient has no known allergies.    Review of Systems   Review of Systems  Physical Exam Updated Vital Signs BP (!) 112/58   Pulse 81   Temp 98.4 F (36.9 C) (Oral)   Resp 16   Ht 4' 9 (1.448 m)   Wt 99.3 kg   LMP 01/20/2020 (Exact Date)   SpO2 94%   BMI 47.39 kg/m  Physical Exam Vitals and nursing note reviewed.  Constitutional:      General: She is not in acute distress.    Appearance: She is well-developed. She is not diaphoretic.  HENT:     Head: Normocephalic and atraumatic.     Ears:     Comments: Left TM bulging, opaque white Eyes:     Conjunctiva/sclera: Conjunctivae normal.  Cardiovascular:     Rate and Rhythm: Normal rate and regular rhythm.     Heart sounds: Normal heart sounds. No murmur heard.    No friction rub. No gallop.  Pulmonary:     Effort: Pulmonary effort is normal. No respiratory distress.     Breath sounds: Normal breath sounds. No wheezing or rales.  Abdominal:     General: There is no distension.     Palpations: Abdomen is soft.     Tenderness: There is abdominal tenderness (mild epigastric, negative Varden). There is no guarding.  Musculoskeletal:        General: No tenderness.     Cervical back: Normal range of motion.  Skin:    General: Skin is warm and dry.     Findings: No erythema or rash.  Neurological:     Mental Status: She is alert and oriented to person, place, and time.     ED Results / Procedures / Treatments   Labs (all labs ordered are listed, but only abnormal results are displayed) Labs Reviewed  COMPREHENSIVE METABOLIC PANEL - Abnormal; Notable for the following components:      Result Value   Potassium 3.3 (*)    Glucose, Bld 157 (*)    Calcium 8.8 (*)    All other components within normal limits  URINALYSIS, ROUTINE W REFLEX MICROSCOPIC - Abnormal; Notable for  the following components:   APPearance CLOUDY (*)    Hgb urine dipstick LARGE (*)    Ketones, ur 15 (*)    Protein, ur 30 (*)    Nitrite POSITIVE (*)    Leukocytes,Ua LARGE (*)    Bacteria, UA MANY (*)    All other components within normal limits  RESP PANEL BY RT-PCR (RSV, FLU A&B, COVID)  RVPGX2  LIPASE, BLOOD  CBC    EKG None  Radiology No results found.  Procedures Procedures    Medications Ordered in ED Medications  potassium chloride   SA (KLOR-CON  M) CR tablet 40 mEq (40 mEq Oral Given 09/02/23 2032)  lactated ringers  bolus 500 mL (0 mLs Intravenous Stopped 09/02/23 2155)    ED Course/ Medical Decision Making/ A&P                                  52yo female with history of anxiety, asthma, acne (intermittent bactrim), fibromyalgia, eustachian tube dysfunction who presents with multiple concerns including concern for body aches, diarrhea, ear pain, abdominal discomfort.  Abdominal exam benign, doubt diverticulitis, cholecystitis, appendicitis, SBO.  Declines pelvic exam, overall history not consistent with PID, TOA, torsion.   Labs completed and personally about interpreted by me show negative COVID, influenza and RSV testing.  Lipase is within normal limits.  Potassium is slightly low.  No evidence of anemia or leukocytosis  .   Urinalysis appears contaminated, however is consistent with urinary tract infection with positive nitrites, large leukocytes and many bacteria.    Given rx for cefdinir  for UTI ans also for left sided otitis emdia given pain in this location and exam.  She was unable to provide stool sample--discussed this can be done as outpatient-at this time feel abx are indicated with concern for UTI and otitis.   She is able to tolerate po.  Has vaginal itching, offered pelvic however declines-will give dose of fluconzaole for presumed yeast infection.  Patient discharged in stable condition with understanding of reasons to return.          Final  Clinical Impression(s) / ED Diagnoses Final diagnoses:  Urinary tract infection without hematuria, site unspecified  Diarrhea, unspecified type  Body aches  Acute suppurative otitis media of left ear without spontaneous rupture of tympanic membrane, recurrence not specified    Rx / DC Orders ED Discharge Orders          Ordered    cefdinir  (OMNICEF ) 300 MG capsule  2 times daily        09/02/23 2245    fluconazole  (DIFLUCAN ) 150 MG tablet        09/02/23 2245              Dreama Longs, MD 09/03/23 1054

## 2023-09-02 NOTE — ED Triage Notes (Signed)
 Pt POV from home reporting intermittent fevers, fatigue, and diarrhea x10 days, caring for pt with cdiff, concerned for same.

## 2023-09-05 ENCOUNTER — Telehealth (HOSPITAL_BASED_OUTPATIENT_CLINIC_OR_DEPARTMENT_OTHER): Payer: Self-pay | Admitting: Emergency Medicine

## 2023-09-05 ENCOUNTER — Other Ambulatory Visit (HOSPITAL_COMMUNITY)
Admission: RE | Admit: 2023-09-05 | Discharge: 2023-09-05 | Disposition: A | Discharge status: Home / Self Care | Payer: Medicaid Other | Attending: Emergency Medicine | Admitting: Emergency Medicine

## 2023-09-05 DIAGNOSIS — F419 Anxiety disorder, unspecified: Secondary | ICD-10-CM | POA: Diagnosis not present

## 2023-09-05 DIAGNOSIS — H6692 Otitis media, unspecified, left ear: Secondary | ICD-10-CM | POA: Diagnosis not present

## 2023-09-05 DIAGNOSIS — E118 Type 2 diabetes mellitus with unspecified complications: Secondary | ICD-10-CM | POA: Diagnosis not present

## 2023-09-05 DIAGNOSIS — R197 Diarrhea, unspecified: Secondary | ICD-10-CM | POA: Insufficient documentation

## 2023-09-05 DIAGNOSIS — M797 Fibromyalgia: Secondary | ICD-10-CM | POA: Diagnosis not present

## 2023-09-05 DIAGNOSIS — J45909 Unspecified asthma, uncomplicated: Secondary | ICD-10-CM | POA: Diagnosis not present

## 2023-09-05 DIAGNOSIS — I10 Essential (primary) hypertension: Secondary | ICD-10-CM | POA: Diagnosis not present

## 2023-09-05 DIAGNOSIS — N390 Urinary tract infection, site not specified: Secondary | ICD-10-CM | POA: Diagnosis not present

## 2023-09-05 DIAGNOSIS — Z0000 Encounter for general adult medical examination without abnormal findings: Secondary | ICD-10-CM | POA: Diagnosis not present

## 2023-09-05 DIAGNOSIS — E782 Mixed hyperlipidemia: Secondary | ICD-10-CM | POA: Diagnosis not present

## 2023-09-05 LAB — C DIFFICILE QUICK SCREEN W PCR REFLEX
C Diff antigen: NEGATIVE
C Diff interpretation: NOT DETECTED
C Diff toxin: NEGATIVE

## 2023-09-06 NOTE — Telephone Encounter (Signed)
Pt brought stool sample, needs cdiff re-ordered as expired

## 2023-09-12 ENCOUNTER — Ambulatory Visit: Payer: Medicaid Other | Admitting: Orthopaedic Surgery

## 2023-09-14 ENCOUNTER — Emergency Department (HOSPITAL_BASED_OUTPATIENT_CLINIC_OR_DEPARTMENT_OTHER)
Admission: EM | Admit: 2023-09-14 | Discharge: 2023-09-14 | Disposition: A | Discharge status: Home / Self Care | Payer: Medicaid Other | Attending: Emergency Medicine | Admitting: Emergency Medicine

## 2023-09-14 ENCOUNTER — Encounter (HOSPITAL_BASED_OUTPATIENT_CLINIC_OR_DEPARTMENT_OTHER): Payer: Self-pay | Admitting: Emergency Medicine

## 2023-09-14 ENCOUNTER — Telehealth: Payer: Self-pay

## 2023-09-14 ENCOUNTER — Other Ambulatory Visit: Payer: Self-pay

## 2023-09-14 DIAGNOSIS — M791 Myalgia, unspecified site: Secondary | ICD-10-CM | POA: Insufficient documentation

## 2023-09-14 DIAGNOSIS — Z7951 Long term (current) use of inhaled steroids: Secondary | ICD-10-CM | POA: Insufficient documentation

## 2023-09-14 DIAGNOSIS — Z20822 Contact with and (suspected) exposure to covid-19: Secondary | ICD-10-CM | POA: Diagnosis not present

## 2023-09-14 DIAGNOSIS — Z79899 Other long term (current) drug therapy: Secondary | ICD-10-CM | POA: Diagnosis not present

## 2023-09-14 DIAGNOSIS — R110 Nausea: Secondary | ICD-10-CM | POA: Insufficient documentation

## 2023-09-14 DIAGNOSIS — R739 Hyperglycemia, unspecified: Secondary | ICD-10-CM | POA: Diagnosis not present

## 2023-09-14 DIAGNOSIS — H9203 Otalgia, bilateral: Secondary | ICD-10-CM | POA: Insufficient documentation

## 2023-09-14 DIAGNOSIS — R197 Diarrhea, unspecified: Secondary | ICD-10-CM | POA: Diagnosis present

## 2023-09-14 DIAGNOSIS — R5383 Other fatigue: Secondary | ICD-10-CM | POA: Insufficient documentation

## 2023-09-14 DIAGNOSIS — E1165 Type 2 diabetes mellitus with hyperglycemia: Secondary | ICD-10-CM | POA: Diagnosis not present

## 2023-09-14 DIAGNOSIS — R0981 Nasal congestion: Secondary | ICD-10-CM | POA: Insufficient documentation

## 2023-09-14 DIAGNOSIS — J45909 Unspecified asthma, uncomplicated: Secondary | ICD-10-CM | POA: Insufficient documentation

## 2023-09-14 DIAGNOSIS — D72829 Elevated white blood cell count, unspecified: Secondary | ICD-10-CM | POA: Insufficient documentation

## 2023-09-14 LAB — BASIC METABOLIC PANEL
Anion gap: 11 (ref 5–15)
BUN: 11 mg/dL (ref 6–20)
CO2: 26 mmol/L (ref 22–32)
Calcium: 9.7 mg/dL (ref 8.9–10.3)
Chloride: 99 mmol/L (ref 98–111)
Creatinine, Ser: 0.49 mg/dL (ref 0.44–1.00)
GFR, Estimated: >60 mL/min (ref >60)
Glucose, Bld: 244 mg/dL — ABNORMAL HIGH (ref 70–99)
Potassium: 4.1 mmol/L (ref 3.5–5.1)
Sodium: 136 mmol/L (ref 135–145)

## 2023-09-14 LAB — RESP PANEL BY RT-PCR (RSV, FLU A&B, COVID)  RVPGX2
Influenza A by PCR: NEGATIVE
Influenza B by PCR: NEGATIVE
Resp Syncytial Virus by PCR: NEGATIVE
SARS Coronavirus 2 by RT PCR: NEGATIVE

## 2023-09-14 LAB — CBC
HCT: 43.6 % (ref 36.0–46.0)
Hemoglobin: 14.8 g/dL (ref 12.0–15.0)
MCH: 29 pg (ref 26.0–34.0)
MCHC: 33.9 g/dL (ref 30.0–36.0)
MCV: 85.3 fL (ref 80.0–100.0)
Platelets: 314 10*3/uL (ref 150–400)
RBC: 5.11 MIL/uL (ref 3.87–5.11)
RDW: 12.2 % (ref 11.5–15.5)
WBC: 11.3 10*3/uL — ABNORMAL HIGH (ref 4.0–10.5)
nRBC: 0 % (ref 0.0–0.2)

## 2023-09-14 MED ORDER — DIPHENHYDRAMINE HCL 25 MG PO CAPS
25.0000 mg | ORAL_CAPSULE | Freq: Once | ORAL | Status: AC
  Administered 2023-09-14: 25 mg via ORAL
  Filled 2023-09-14: qty 1

## 2023-09-14 MED ORDER — METOCLOPRAMIDE HCL 10 MG PO TABS
10.0000 mg | ORAL_TABLET | Freq: Once | ORAL | Status: AC
  Administered 2023-09-14: 10 mg via ORAL
  Filled 2023-09-14: qty 1

## 2023-09-14 NOTE — ED Triage Notes (Signed)
C/o body aches, ear pain, and fevers x 4 days. Recently had noro last month, states has slightly improved.

## 2023-09-14 NOTE — Telephone Encounter (Signed)
 Telephoned patient at mobile number. Left a voice message with [facility] contact information.

## 2023-09-14 NOTE — ED Provider Notes (Cosign Needed Addendum)
[place] EMERGENCY DEPARTMENT AT [facility] Provider Note   CSN: [id number] Arrival date & time: 09/14/23  1442     History  Chief Complaint  Patient presents with   Generalized Body Aches   Diarrhea    Kathryn Moon is a 53 y.o. female.   Diarrhea  Patient is a 53 year old female with past medical history significant for fibromyalgia, anxiety, asthma eustachian tube dysfunction  Patient presents emergency room today with complaints of bilateral ear pain, body aches, nausea, and diarrhea she states that she was diagnosed with norovirus last month but states that she feels that she has this again.  She denies any vomiting with chest pain or difficulty breathing.  No blood in her stool, no recent antibiotics.     Home Medications Prior to Admission medications   Medication Sig Start Date End Date Taking? Authorizing Provider  albuterol (ACCUNEB) 0.63 MG/3ML nebulizer solution Take 1 ampule by nebulization every 6 (six) hours as needed for wheezing or shortness of breath.    [provider]  ALPRAZolam Prudy Feeler) 0.5 MG tablet Take 0.25-0.5 mg by mouth daily as needed for anxiety or sleep. Takes for anxiety     [provider]  amoxicillin (AMOXIL) 875 MG tablet Take 1 tablet (875 mg total) by mouth 2 (two) times daily. 12/09/22   Garrison, [place] N, FNP  B Complex Vitamins (VITAMIN B COMPLEX PO) Take 1 tablet by mouth daily.    [provider]  cholecalciferol (VITAMIN D3) 25 MCG (1000 UNIT) tablet Take 1,000 Units by mouth daily.    [provider]  famotidine (PEPCID) 20 MG tablet Take 1 tablet (20 mg total) by mouth 2 (two) times daily. 02/06/20   Henderly, Britni A, PA-C  fluconazole (DIFLUCAN) 150 MG tablet Take 1 dose if needed for vaginal itching, another in 72 hours if itching persists. 09/02/23   Alvira Monday, MD  fluticasone (FLONASE) 50 MCG/ACT nasal spray Place 1 spray into both nostrils daily.     [provider]  hydrOXYzine (ATARAX/VISTARIL) 25 MG tablet Take 1 tablet (25 mg total) by mouth every 8 (eight) hours as needed for anxiety. 07/03/20   LampteyBritta Mccreedy, MD  IBUPROFEN PO Take by mouth.    [provider]  loratadine (CLARITIN) 10 MG tablet Take 10 mg by mouth daily as needed for allergies.    [provider]  nystatin (MYCOSTATIN/NYSTOP) powder Apply 1 application topically 3 (three) times daily. 09/18/21   Valentino Nose, NP  ondansetron (ZOFRAN) 4 MG tablet Take 1 tablet (4 mg total) by mouth every 8 (eight) hours as needed for nausea or vomiting. 02/06/20   Henderly, Britni A, PA-C  Pedialyte (PEDIALYTE) SOLN Take 240 mLs by mouth daily.    [provider]  promethazine (PHENERGAN) 25 MG tablet Take 25 mg by mouth every 4 (four) hours as needed for nausea/vomiting. 12/20/18   [provider]  promethazine-dextromethorphan (PROMETHAZINE-DM) 6.25-15 MG/5ML syrup Take 5 mLs by mouth 3 (three) times daily as needed for cough. 10/13/21   Bing Neighbors, NP  sucralfate (CARAFATE) 1 GM/10ML suspension Take 10 mLs (1 g total) by mouth 4 (four) times daily -  with meals and at bedtime. 02/06/20   Henderly, Britni A, PA-C  prochlorperazine (COMPAZINE) 10 MG tablet Take 1 tablet (10 mg total) by mouth 2 (two) times daily as needed (headache). Patient not taking: Reported on 05/07/2019 09/18/18 01/24/20  Shaune Pollack, MD      Allergies  Patient has no known allergies.    Review of Systems   Review of Systems  Gastrointestinal:  Positive for diarrhea.    Physical Exam Updated Vital Signs BP (!) 151/99 (BP Location: Right Arm)   Pulse 98   Temp 98.2 F (36.8 C)   Resp 18   LMP 01/20/2020 (Exact Date)   SpO2 99%  Physical Exam Vitals and nursing note reviewed.  Constitutional:      General: She is not in acute distress. HENT:     Head: Normocephalic and atraumatic.     Right Ear: Tympanic membrane normal.     Left Ear: Tympanic  membrane normal.     Nose: Congestion present.     Mouth/Throat:     Mouth: Mucous membranes are moist.  Eyes:     General: No scleral icterus. Cardiovascular:     Rate and Rhythm: Normal rate and regular rhythm.     Pulses: Normal pulses.     Heart sounds: Normal heart sounds.  Pulmonary:     Effort: Pulmonary effort is normal. No respiratory distress.     Breath sounds: No wheezing.  Abdominal:     Palpations: Abdomen is soft.     Tenderness: There is no abdominal tenderness. There is no guarding or rebound.  Musculoskeletal:     Cervical back: Normal range of motion.     Right lower leg: No edema.     Left lower leg: No edema.  Skin:    General: Skin is warm and dry.     Capillary Refill: Capillary refill takes less than 2 seconds.  Neurological:     Mental Status: She is alert. Mental status is at baseline.  Psychiatric:        Mood and Affect: Mood normal.        Behavior: Behavior normal.     ED Results / Procedures / Treatments   Labs (all labs ordered are listed, but only abnormal results are displayed) Labs Reviewed  CBC - Abnormal; Notable for the following components:      Result Value   WBC 11.3 (*)    All other components within normal limits  BASIC METABOLIC PANEL - Abnormal; Notable for the following components:   Glucose, Bld 244 (*)    All other components within normal limits  RESP PANEL BY RT-PCR (RSV, FLU A&B, COVID)  RVPGX2    EKG None  Radiology No results found.  Procedures Procedures    Medications Ordered in ED Medications  metoCLOPramide (REGLAN) tablet 10 mg (10 mg Oral Given 09/14/23 1600)  diphenhydrAMINE (BENADRYL) capsule 25 mg (25 mg Oral Given 09/14/23 1600)    ED Course/ Medical Decision Making/ A&P                                 Medical Decision Making Amount and/or Complexity of Data Reviewed Labs: ordered.  Risk Prescription drug management.   Patient is a 53 year old female with past medical history  significant for fibromyalgia, anxiety, asthma eustachian tube dysfunction  Patient presents emergency room today with complaints of bilateral ear pain, body aches, nausea, and diarrhea she states that she was diagnosed with norovirus last month but states that she feels that she has this again.  She denies any vomiting with chest pain or difficulty breathing.  No blood in her stool, no recent antibiotics.  Physical exam unremarkable.  Somewhat boggy nasal mucosa with some congestion present otherwise  normal exam.  Abdomen soft nontender.  No guarding or rebound.  Reglan Benadryl given p.o. and patient was able to tolerate p.o. fluids without difficulty.   She is up and walking around during her ER visit  I reevaluated patient prior to discharge.  Her BMP shows hyperglycemia of 244 CBC with mild nonspecific leukocytosis hemoglobin high end of normal likely somewhat hemoconcentrated/dehydrated.  RSV COVID influenza negative.  Patient discharged home with Tylenol ibuprofen recommendations and recommendations to hydrate and avoid sugary beverages.  She will follow-up with her primary care doctor she states she has an appointment tomorrow to discuss her elevated blood sugar more.  She declines metformin here.   Final Clinical Impression(s) / ED Diagnoses Final diagnoses:  Other fatigue  Hyperglycemia    Rx / DC Orders ED Discharge Orders     None         Gailen Shelter, PA 09/14/23 1728    Solon Augusta Washburn, [place] 09/14/23 1728    Linwood Dibbles, MD 09/15/23 [phone]

## 2023-09-14 NOTE — Discharge Instructions (Addendum)
I suspect your symptoms are related to a viral illness although your increase in blood sugar may also be causing some of your fatigue and dehydration and feeling ill. Make sure you are drinking plenty of water, refrain from sugar by using diet Pedialyte, sugar-free drinks, decreasing carbohydrate intake and sugar intake.  Tylenol and ibuprofen as discussed below  Please use Tylenol or ibuprofen for pain.  You may use 600 mg ibuprofen every 6 hours or 1000 mg of Tylenol every 6 hours.  You may choose to alternate between the 2.  This would be most effective.  Not to exceed 4 g of Tylenol within 24 hours.  Not to exceed 3200 mg ibuprofen 24 hours.    As we discussed, I would like for you to have your blood work/blood sugar rechecked expediently.  As we discussed, I would have prescribed you metformin however

## 2023-10-02 NOTE — Progress Notes (Unsigned)
 Office Visit Note   Patient: Kathryn Moon           Date of Birth: [birth year]           MRN: [id number] Visit Date: 10/03/2023              Requested by: No referring provider defined for this encounter. PCP: Clovis Riley, L.August Saucer, MD (Inactive)   Assessment & Plan: Visit Diagnoses:  1. Right carpal tunnel syndrome   2. Left carpal tunnel syndrome     Plan: Kathryn Moon is a 53 year old female with severe left carpal tunnel syndrome.  She had nerve conduction study years ago equal.  I saw her a couple years ago and at that time she was unable to undergo surgery.  At this point she is ready to move forward with surgery.  Risk benefits prognosis reviewed.  Questions encouraged and answered.  Kathryn Moon will call her to confirm surgery date.  Follow-Up Instructions: No follow-ups on file.   Orders:  Orders Placed This Encounter  Procedures   XR Wrist Complete Left   XR Wrist Complete Right   No orders of the defined types were placed in this encounter.     Procedures: No procedures performed   Clinical Data: No additional findings.   Subjective: Chief Complaint  Patient presents with   Right Wrist - Pain   Left Wrist - Pain    HPI Kathryn Moon returns today to follow-up for bilateral carpal tunnel syndrome.  Had nerve conduction studies years ago which showed severe disease.  Review of Systems  Constitutional: Negative.   HENT: Negative.    Eyes: Negative.   Respiratory: Negative.    Cardiovascular: Negative.   Endocrine: Negative.   Musculoskeletal: Negative.   Neurological: Negative.   Hematological: Negative.   Psychiatric/Behavioral: Negative.    All other systems reviewed and are negative.    Objective: Vital Signs: LMP 01/20/2020 (Exact Date)   Physical Exam Vitals and nursing note reviewed.  Constitutional:      Appearance: She is well-developed.  HENT:     Head: Atraumatic.     Nose: Nose normal.  Eyes:     Extraocular Movements: Extraocular  movements intact.  Cardiovascular:     Pulses: Normal pulses.  Pulmonary:     Effort: Pulmonary effort is normal.  Abdominal:     Palpations: Abdomen is soft.  Musculoskeletal:     Cervical back: Neck supple.  Skin:    General: Skin is warm.     Capillary Refill: Capillary refill takes less than 2 seconds.  Neurological:     Mental Status: She is alert. Mental status is at baseline.  Psychiatric:        Behavior: Behavior normal.        Thought Content: Thought content normal.        Judgment: Judgment normal.     Ortho Exam Examination of the hands is unchanged. Specialty Comments:  No specialty comments available.  Imaging: No results found.   PMFS History: There are no active problems to display for this patient.  Past Medical History:  Diagnosis Date   Acne    on bactrim   Anxiety    Asthma    Eustachian tube dysfunction    Fibromyalgia     Family History  Problem Relation Age of Onset   Healthy Father    Anesthesia problems Neg Hx     Past Surgical History:  Procedure Laterality Date   MYRINGOTOMY WITH TUBE PLACEMENT  TUBAL LIGATION     Social History   Occupational History   Not on file  Tobacco Use   Smoking status: Former    Types: Cigarettes   Smokeless tobacco: Never  Vaping Use   Vaping status: Never Used  Substance and Sexual Activity   Alcohol use: Not Currently   Drug use: Not Currently   Sexual activity: Not Currently    Birth control/protection: Surgical

## 2023-10-03 ENCOUNTER — Other Ambulatory Visit: Payer: Self-pay

## 2023-10-03 ENCOUNTER — Ambulatory Visit: Payer: Medicaid Other | Admitting: Orthopaedic Surgery

## 2023-10-03 DIAGNOSIS — G5603 Carpal tunnel syndrome, bilateral upper limbs: Secondary | ICD-10-CM

## 2023-10-03 DIAGNOSIS — G5602 Carpal tunnel syndrome, left upper limb: Secondary | ICD-10-CM | POA: Diagnosis not present

## 2023-10-03 DIAGNOSIS — G5601 Carpal tunnel syndrome, right upper limb: Secondary | ICD-10-CM | POA: Diagnosis not present

## 2023-10-04 ENCOUNTER — Telehealth: Payer: Self-pay | Admitting: Orthopaedic Surgery

## 2023-10-04 NOTE — Telephone Encounter (Signed)
 Left message on patient's voicemail providing my name and direct number to call back and scheduled left carpal tunnel release with Dr. Roda Shutters.

## 2023-10-24 ENCOUNTER — Telehealth: Payer: Self-pay | Admitting: Orthopaedic Surgery

## 2023-10-24 NOTE — Telephone Encounter (Signed)
 Left second message on patient's voicemail providing my name and direct number to call back and scheduled left carpal tunnel release with Dr. Roda Shutters.

## 2023-11-13 DIAGNOSIS — E118 Type 2 diabetes mellitus with unspecified complications: Secondary | ICD-10-CM | POA: Diagnosis not present

## 2023-11-14 NOTE — Progress Notes (Signed)
 Called pt to do pre-op call and she said she needed to cancel the surgery due to new diabetes diagnosis and management. I left a message with Debbie at Dr.Xu's office making her aware.

## 2023-11-22 ENCOUNTER — Encounter (HOSPITAL_BASED_OUTPATIENT_CLINIC_OR_DEPARTMENT_OTHER): Payer: Self-pay

## 2023-11-22 ENCOUNTER — Ambulatory Visit (HOSPITAL_BASED_OUTPATIENT_CLINIC_OR_DEPARTMENT_OTHER): Admit: 2023-11-22 | Admitting: Orthopaedic Surgery

## 2023-11-22 SURGERY — CARPAL TUNNEL RELEASE
Anesthesia: Monitor Anesthesia Care | Site: Wrist | Laterality: Left

## 2023-11-29 ENCOUNTER — Encounter: Admitting: Physician Assistant

## 2023-11-30 DIAGNOSIS — E1165 Type 2 diabetes mellitus with hyperglycemia: Secondary | ICD-10-CM | POA: Diagnosis not present

## 2024-02-08 ENCOUNTER — Telehealth: Payer: Self-pay | Admitting: Orthopaedic Surgery

## 2024-02-08 NOTE — Telephone Encounter (Signed)
 Patient would like to have BILATERAL carpal tunnel release 03-06-24 at [facility].   She states her preference is to have both RIGHT and LEFT done at the same time because if it hurts like hell on the right, she will never get the left done. If having having is not an option, she would like to start with the right. Also patient says wiping her rear is not an issue since she has a bidet.  She would like to get a idea of the recovery timeline once surgery has been scheduled.  Please provide updated surgery sheet.

## 2024-02-09 NOTE — Telephone Encounter (Signed)
 That's fine

## 2024-02-14 ENCOUNTER — Emergency Department (HOSPITAL_BASED_OUTPATIENT_CLINIC_OR_DEPARTMENT_OTHER)
Admission: EM | Admit: 2024-02-14 | Discharge: 2024-02-14 | Disposition: A | Discharge status: Home / Self Care | Attending: Emergency Medicine | Admitting: Emergency Medicine

## 2024-02-14 ENCOUNTER — Emergency Department (HOSPITAL_BASED_OUTPATIENT_CLINIC_OR_DEPARTMENT_OTHER)

## 2024-02-14 ENCOUNTER — Emergency Department (HOSPITAL_BASED_OUTPATIENT_CLINIC_OR_DEPARTMENT_OTHER): Admitting: Radiology

## 2024-02-14 ENCOUNTER — Encounter (HOSPITAL_BASED_OUTPATIENT_CLINIC_OR_DEPARTMENT_OTHER): Payer: Self-pay

## 2024-02-14 ENCOUNTER — Other Ambulatory Visit: Payer: Self-pay

## 2024-02-14 DIAGNOSIS — J019 Acute sinusitis, unspecified: Secondary | ICD-10-CM | POA: Diagnosis not present

## 2024-02-14 DIAGNOSIS — R590 Localized enlarged lymph nodes: Secondary | ICD-10-CM | POA: Diagnosis not present

## 2024-02-14 DIAGNOSIS — R739 Hyperglycemia, unspecified: Secondary | ICD-10-CM | POA: Insufficient documentation

## 2024-02-14 DIAGNOSIS — M542 Cervicalgia: Secondary | ICD-10-CM | POA: Diagnosis not present

## 2024-02-14 DIAGNOSIS — R519 Headache, unspecified: Secondary | ICD-10-CM | POA: Diagnosis not present

## 2024-02-14 DIAGNOSIS — I6521 Occlusion and stenosis of right carotid artery: Secondary | ICD-10-CM | POA: Diagnosis not present

## 2024-02-14 DIAGNOSIS — R079 Chest pain, unspecified: Secondary | ICD-10-CM | POA: Diagnosis not present

## 2024-02-14 DIAGNOSIS — R0789 Other chest pain: Secondary | ICD-10-CM | POA: Diagnosis not present

## 2024-02-14 LAB — BASIC METABOLIC PANEL WITH GFR
Anion gap: 15 (ref 5–15)
BUN: 14 mg/dL (ref 6–20)
CO2: 22 mmol/L (ref 22–32)
Calcium: 9.7 mg/dL (ref 8.9–10.3)
Chloride: 102 mmol/L (ref 98–111)
Creatinine, Ser: 0.52 mg/dL (ref 0.44–1.00)
GFR, Estimated: >60 mL/min (ref >60)
Glucose, Bld: 166 mg/dL — ABNORMAL HIGH (ref 70–99)
Potassium: 4.4 mmol/L (ref 3.5–5.1)
Sodium: 138 mmol/L (ref 135–145)

## 2024-02-14 LAB — TROPONIN T, HIGH SENSITIVITY
Troponin T High Sensitivity: <15 ng/L (ref <19)
Troponin T High Sensitivity: <15 ng/L (ref <19)

## 2024-02-14 LAB — CBC
HCT: 41.2 % (ref 36.0–46.0)
Hemoglobin: 13.8 g/dL (ref 12.0–15.0)
MCH: 28.4 pg (ref 26.0–34.0)
MCHC: 33.5 g/dL (ref 30.0–36.0)
MCV: 84.8 fL (ref 80.0–100.0)
Platelets: 240 K/uL (ref 150–400)
RBC: 4.86 MIL/uL (ref 3.87–5.11)
RDW: 12.8 % (ref 11.5–15.5)
WBC: 8.8 K/uL (ref 4.0–10.5)
nRBC: 0 % (ref 0.0–0.2)

## 2024-02-14 LAB — RESP PANEL BY RT-PCR (RSV, FLU A&B, COVID)  RVPGX2
Influenza A by PCR: NEGATIVE
Influenza B by PCR: NEGATIVE
Resp Syncytial Virus by PCR: NEGATIVE
SARS Coronavirus 2 by RT PCR: NEGATIVE

## 2024-02-14 LAB — D-DIMER, QUANTITATIVE: D-Dimer, Quant: <0.27 ug{FEU}/mL (ref 0.00–0.50)

## 2024-02-14 MED ORDER — FLUCONAZOLE 150 MG PO TABS: 150.0000 mg | ORAL_TABLET | Freq: Once | ORAL | 0 refills | Status: AC

## 2024-02-14 MED ORDER — AMOXICILLIN-POT CLAVULANATE 875-125 MG PO TABS
1.0000 | ORAL_TABLET | Freq: Two times a day (BID) | ORAL | 0 refills | Status: AC
Start: 2024-02-15 — End: ?

## 2024-02-14 MED ORDER — LACTATED RINGERS IV BOLUS
1000.0000 mL | Freq: Once | INTRAVENOUS | Status: AC
  Administered 2024-02-14: 1000 mL via INTRAVENOUS

## 2024-02-14 MED ORDER — AMOXICILLIN-POT CLAVULANATE 875-125 MG PO TABS
1.0000 | ORAL_TABLET | Freq: Once | ORAL | Status: AC
  Administered 2024-02-14: 1 via ORAL
  Filled 2024-02-14: qty 1

## 2024-02-14 MED ORDER — IOHEXOL 300 MG/ML  SOLN
100.0000 mL | Freq: Once | INTRAMUSCULAR | Status: AC | PRN
  Administered 2024-02-14: 75 mL via INTRAVENOUS

## 2024-02-14 MED ORDER — PROCHLORPERAZINE EDISYLATE 10 MG/2ML IJ SOLN
10.0000 mg | Freq: Once | INTRAMUSCULAR | Status: AC
  Administered 2024-02-14: 10 mg via INTRAVENOUS
  Filled 2024-02-14: qty 2

## 2024-02-14 MED ORDER — KETOROLAC TROMETHAMINE 15 MG/ML IJ SOLN
15.0000 mg | Freq: Once | INTRAMUSCULAR | Status: AC
  Administered 2024-02-14: 15 mg via INTRAVENOUS
  Filled 2024-02-14: qty 1

## 2024-02-14 MED ORDER — PANTOPRAZOLE SODIUM 40 MG IV SOLR
40.0000 mg | Freq: Once | INTRAVENOUS | Status: AC
  Administered 2024-02-14: 40 mg via INTRAVENOUS
  Filled 2024-02-14: qty 10

## 2024-02-14 NOTE — ED Provider Notes (Signed)
 [place] EMERGENCY DEPARTMENT AT [facility] Provider Note   CSN: [id number] Arrival date & time: 02/14/24  1234     Patient presents with: Chest Pain   Kathryn Moon is a 53 y.o. female with history of anxiety and fibromyalgia, presents with concern for a constant sharp pain in the left side of her chest that started yesterday. Denies any pain that worsens with exertion, pain not worse with inspiration. No pain that radiates to the back. Reports some mild shortness of breath. Denies any lower extremity pain or swelling. Reports she has been driving frequently to [facility] which is a 3 hour car ride. No other recent long plane or car rides, no recent surgeries or hospitalizations, no history of blood clots.     Chest Pain      Prior to Admission medications   Medication Sig Start Date End Date Taking? Authorizing Provider  amoxicillin -clavulanate (AUGMENTIN ) 875-125 MG tablet Take 1 tablet by mouth every 12 (twelve) hours. 02/15/24  Yes Veta Palma, PA-C  albuterol  (ACCUNEB ) 0.63 MG/3ML nebulizer solution Take 1 ampule by nebulization every 6 (six) hours as needed for wheezing or shortness of breath.    [provider]  ALPRAZolam (XANAX) 0.5 MG tablet Take 0.25-0.5 mg by mouth daily as needed for anxiety or sleep. Takes for anxiety     [provider]  B Complex Vitamins (VITAMIN B COMPLEX PO) Take 1 tablet by mouth daily.    [provider]  cholecalciferol (VITAMIN D3) 25 MCG (1000 UNIT) tablet Take 1,000 Units by mouth daily.    [provider]  famotidine  (PEPCID ) 20 MG tablet Take 1 tablet (20 mg total) by mouth 2 (two) times daily. 02/06/20   Henderly, Britni A, PA-C  fluconazole  (DIFLUCAN ) 150 MG tablet Take 1 dose if needed for vaginal itching, another in 72 hours if itching persists. 09/02/23   Dreama Longs, MD  fluticasone (FLONASE) 50 MCG/ACT nasal spray Place 1 spray into both nostrils daily.     [provider]   hydrOXYzine  (ATARAX /VISTARIL ) 25 MG tablet Take 1 tablet (25 mg total) by mouth every 8 (eight) hours as needed for anxiety. 07/03/20   LampteyAleene KIDD, MD  IBUPROFEN  PO Take by mouth.    [provider]  loratadine (CLARITIN) 10 MG tablet Take 10 mg by mouth daily as needed for allergies.    [provider]  nystatin  (MYCOSTATIN /NYSTOP ) powder Apply 1 application topically 3 (three) times daily. 09/18/21   Chandra Harlene DELENA, NP  ondansetron  (ZOFRAN ) 4 MG tablet Take 1 tablet (4 mg total) by mouth every 8 (eight) hours as needed for nausea or vomiting. 02/06/20   Henderly, Britni A, PA-C  Pedialyte (PEDIALYTE) SOLN Take 240 mLs by mouth daily.    [provider]  promethazine  (PHENERGAN ) 25 MG tablet Take 25 mg by mouth every 4 (four) hours as needed for nausea/vomiting. 12/20/18   [provider]  promethazine -dextromethorphan (PROMETHAZINE -DM) 6.25-15 MG/5ML syrup Take 5 mLs by mouth 3 (three) times daily as needed for cough. 10/13/21   Arloa Suzen RAMAN, NP  sucralfate  (CARAFATE ) 1 GM/10ML suspension Take 10 mLs (1 g total) by mouth 4 (four) times daily -  with meals and at bedtime. 02/06/20   Henderly, Britni A, PA-C  prochlorperazine  (COMPAZINE ) 10 MG tablet Take 1 tablet (10 mg total) by mouth 2 (two) times daily as needed (headache). Patient not taking: Reported on 05/07/2019 09/18/18 01/24/20  [name], MD    Allergies: Pollen extract  Review of Systems  Cardiovascular:  Positive for chest pain.    Updated Vital Signs BP 136/73   Pulse 70   Temp 98.7 F (37.1 C)   Resp 17   Ht 5' 1 (1.549 m)   Wt 103 kg   LMP 01/20/2020 (Exact Date)   SpO2 99%   BMI 42.89 kg/m   Physical Exam Vitals and nursing note reviewed.  Constitutional:      General: She is not in acute distress.    Appearance: She is well-developed.  HENT:     Head: Normocephalic and atraumatic.     Comments: Pimple under left lower lip.  No other skin changes of the  face or wounds.   Opens mouth fully with no trismus. No dental abscesses    Right Ear: Tympanic membrane normal.     Ears:     Comments: Left TM with some bulging, no erythema.  No drainage from the left ear Eyes:     Extraocular Movements: Extraocular movements intact.     Conjunctiva/sclera: Conjunctivae normal.     Pupils: Pupils are equal, round, and reactive to light.  Neck:     Comments: Left submandibular lymphadenopathy  Cardiovascular:     Rate and Rhythm: Normal rate and regular rhythm.     Heart sounds: No murmur heard.    Comments: Radial and pedal pulses 2+ bilaterally Pulmonary:     Effort: Pulmonary effort is normal. No respiratory distress.     Breath sounds: Normal breath sounds.  Abdominal:     Palpations: Abdomen is soft.     Tenderness: There is no abdominal tenderness.  Musculoskeletal:        General: No swelling.       Arms:     Cervical back: Neck supple.     Right lower leg: No edema.     Left lower leg: No edema.     Comments: Pain to palpation of left anterior chest wall  Skin:    General: Skin is warm and dry.     Capillary Refill: Capillary refill takes less than 2 seconds.  Neurological:     Mental Status: She is alert.  Psychiatric:        Mood and Affect: Mood normal.     (all labs ordered are listed, but only abnormal results are displayed) Labs Reviewed  BASIC METABOLIC PANEL WITH GFR - Abnormal; Notable for the following components:      Result Value   Glucose, Bld 166 (*)    All other components within normal limits  RESP PANEL BY RT-PCR (RSV, FLU A&B, COVID)  RVPGX2  CBC  D-DIMER, QUANTITATIVE  TROPONIN T, HIGH SENSITIVITY  TROPONIN T, HIGH SENSITIVITY    EKG: EKG Interpretation Date/Time:  Wednesday February 14 2024 12:45:03 EDT Ventricular Rate:  82 PR Interval:  142 QRS Duration:  104 QT Interval:  370 QTC Calculation: 432 R Axis:   2  Text Interpretation: Normal sinus rhythm Incomplete right bundle branch block Cannot  rule out Anterior infarct , age undetermined Abnormal ECG No old tracing to compare Confirmed by Emil Share [id number]) on 02/14/2024 1:27:49 PM  Radiology: CT Soft Tissue Neck W Contrast Result Date: 02/14/2024 CLINICAL DATA:  Provided history: Left-sided neck pain. EXAM: CT NECK WITH CONTRAST TECHNIQUE: Multidetector CT imaging of the neck was performed using the standard protocol following the bolus administration of intravenous contrast. RADIATION DOSE REDUCTION: This exam was performed according to the departmental dose-optimization program which includes automated exposure control,  adjustment of the mA and/or kV according to patient size and/or use of iterative reconstruction technique. CONTRAST:  75mL OMNIPAQUE  IOHEXOL  300 MG/ML  SOLN COMPARISON:  Cervical spine CT 12/11/2020. FINDINGS: Pharynx and larynx: No appreciable swelling or mass within the oral cavity, pharynx or larynx. Punctate calcific foci within the bilateral palatine tonsils, which may reflect postinflammatory calcifications and/or tonsilloliths. Salivary glands: No inflammation, mass, or stone. Thyroid : Unremarkable. Lymph nodes: Nonspecific mildly enlarged left level IB lymph node, measuring 12 mm in short axis (series 2, image 50). There are additional lymph nodes within the left neck which are asymmetrically prominent, but not technically enlarged by short axis criteria (measuring subcentimeter). Vascular: The major vascular structures of the neck are patent. Atherosclerotic plaque about the right carotid bifurcation and within the proximal right ICA. Limited intracranial: No evidence of an acute intracranial abnormality within the field of view. Visualized orbits: No orbital mass or acute orbital finding. Mastoids and visualized paranasal sinuses: Mild mucosal thickening within anterior right ethmoid air cells. No significant mastoid effusion. Skeleton: No acute fracture or aggressive osseous lesion. Upper chest: No consolidation within the  imaged lung apices. IMPRESSION: 1. Mildly enlarged left level IB lymph node with prominent (although not technically enlarged) lymph nodes elsewhere within the left neck, nonspecific. Close clinical follow-up is recommended (with imaging follow-up as warranted). 2. No mass or collection identified within the neck. 3. Minor right ethmoid sinus mucosal thickening. 4. Punctate calcific foci within the bilateral palatine tonsils, which may reflect tonsilloliths and/or postinflammatory calcifications. 5. Atherosclerotic plaque about the right carotid bifurcation and within the proximal right internal carotid artery. Please note, this examination is not tailored for the assessment of arterial stenoses. Consider a non-emergent carotid artery duplex for further evaluation. Electronically Signed   By: Rockey Childs D.O.   On: 02/14/2024 16:02   CT Head Wo Contrast Result Date: 02/14/2024 CLINICAL DATA:  Provided history: Headache, new onset (Age >= 51y) EXAM: CT HEAD WITHOUT CONTRAST TECHNIQUE: Contiguous axial images were obtained from the base of the skull through the vertex without intravenous contrast. RADIATION DOSE REDUCTION: This exam was performed according to the departmental dose-optimization program which includes automated exposure control, adjustment of the mA and/or kV according to patient size and/or use of iterative reconstruction technique. COMPARISON:  Head CT 12/11/2020 FINDINGS: Brain: No intracranial hemorrhage, mass effect, or midline shift. No hydrocephalus. The basilar cisterns are patent. No evidence of territorial infarct or acute ischemia. No extra-axial or intracranial fluid collection. Vascular: No hyperdense vessel or unexpected calcification. Skull: Normal. Negative for fracture or focal lesion. Sinuses/Orbits: No acute finding. Minor mucosal thickening in the paranasal sinuses without air-fluid levels. Other: None. IMPRESSION: Negative noncontrast head CT. Electronically Signed   By: Andrea Gasman M.D.   On: 02/14/2024 15:20   DG Chest 2 View Result Date: 02/14/2024 CLINICAL DATA:  Chest pain EXAM: CHEST - 2 VIEW COMPARISON:  Chest portable February 06, 2020 FINDINGS: The heart size and mediastinal contours are within normal limits. Both lungs are clear. The visualized skeletal structures are unremarkable. IMPRESSION: No active cardiopulmonary disease. Electronically Signed   By: Megan  Zare M.D.   On: 02/14/2024 13:40     Procedures   Medications Ordered in the ED  amoxicillin -clavulanate (AUGMENTIN ) 875-125 MG per tablet 1 tablet (has no administration in time range)  pantoprazole  (PROTONIX ) injection 40 mg (40 mg Intravenous Given 02/14/24 1345)  ketorolac  (TORADOL ) 15 MG/ML injection 15 mg (15 mg Intravenous Given 02/14/24 1448)  prochlorperazine  (COMPAZINE ) injection  10 mg (10 mg Intravenous Given 02/14/24 1448)  lactated ringers  bolus 1,000 mL (1,000 mLs Intravenous New Bag/Given 02/14/24 1455)  iohexol  (OMNIPAQUE ) 300 MG/ML solution 100 mL (75 mLs Intravenous Contrast Given 02/14/24 1504)                HEART Score: 2                    Medical Decision Making Amount and/or Complexity of Data Reviewed Labs: ordered. Radiology: ordered.  Risk Prescription drug management.     Differential diagnosis includes but is not limited to ACS, arrhythmia, aortic aneurysm, pericarditis, myocarditis, pericardial effusion, cardiac tamponade, musculoskeletal pain, GERD, Boerhaave's syndrome, DVT/PE, pneumonia, pleural effusion   ED Course:  Upon initial evaluation, patient is well-appearing but anxious.  Stable vitals.  Reporting a sharp pain to the left side of her chest that has been ongoing since yesterday.  She is tender to the anterior chest wall.  No lower extremity edema or calf tenderness to palpation.   Labs Ordered: I Ordered, and personally interpreted labs.  The pertinent results include:   Initial and repeat troponin under 15 D-dimer within normal limits CBC  within normal limits. BMP with elevated glucose at 166, otherwise no electrolyte abnormalities.  Normal creatinine Covid, flu, rsv negative   Imaging Studies ordered: I ordered imaging studies including chest x-ray, CT head, CT soft tissue neck I independently visualized the imaging with scope of interpretation limited to determining acute life threatening conditions related to emergency care. Imaging showed  No acute intracranial abnormalities CT soft tissue neck showed IMPRESSION: 1. Mildly enlarged left level IB lymph node with prominent (although not technically enlarged) lymph nodes elsewhere within the left neck, nonspecific. Close clinical follow-up is recommended (with imaging follow-up as warranted). 2. No mass or collection identified within the neck. 3. Minor right ethmoid sinus mucosal thickening. 4. Punctate calcific foci within the bilateral palatine tonsils, which may reflect tonsilloliths and/or postinflammatory calcifications. 5. Atherosclerotic plaque about the right carotid bifurcation and within the proximal right internal carotid artery. Please note, this examination is not tailored for the assessment of arterial stenoses. Consider a non-emergent carotid artery duplex for further evaluation.   I agree with the radiologist interpretation   Cardiac Monitoring: / EKG: The patient was maintained on a cardiac monitor.  I personally viewed and interpreted the cardiac monitored which showed an underlying rhythm of: Normal sinus rhythm with no ST changes    Medications Given: Augmentin    2:38pm Re-checked on patient, she reports the sharp pain in chest resolved since getting protonix . States she still just doesn't feel well. Reports she has a HA and left sided neck pain that has been ongoing for a couple days.  Normally does not have headaches. No neuro deficits. Will get CT head and start on migraine cocktail.   Upon re-evaluation, patient remains  well-appearing with stable vitals.  Low concern for ACS at this time given troponin remains stable with initial troponin of less than 15 and repeat of less than 15, chest pain non-exertional, and EKG with normal sinus rhythm and no ST changes. HEART score of 2 due to age and obesity. Chest x-ray without any acute abnormality. No concern for DVT or PE at this time given  d-dimer within normal limits.  Given new onset headache, CT head was obtained which showed no acute abnormalities.  There was mucosal thickening in the paranasal and ethmoid sinuses.  Will treat for possible bacterial sinusitis given symptoms  have been ongoing for over a week.  We discussed that she did have an enlarged lymph node on the left side of her neck that she needs to have her evaluated by her PCP within the next week.  Suspect this is just reactive from her sinus infection.  Low concern for any other emergent pathology at this time. Stable and appropriate for discharge home  Impression: Sinus infection Atypical chest pain  Disposition:  The patient was discharged home with instructions to take course of Augmentin  as prescribed.  Follow-up with PCP within the next week for recheck of symptoms. She was instructed to make PCP aware of CT scan results, a copy of these were provided for her Return precautions given.     This chart was dictated using voice recognition software, Dragon. Despite the best efforts of this provider to proofread and correct errors, errors may still occur which can change documentation meaning.       Final diagnoses:  Subacute sinusitis, unspecified location  Submandibular lymphadenopathy  Atypical chest pain    ED Discharge Orders          Ordered    amoxicillin -clavulanate (AUGMENTIN ) 875-125 MG tablet  Every 12 hours        02/14/24 1626               Veta Palma, PA-C 02/14/24 1633    Emil Share, DO 02/15/24 1008

## 2024-02-14 NOTE — Discharge Instructions (Addendum)
 The CT of your head showed that he may have a sinus infection.  You also had a large lymph node on the left side of your neck which is likely secondary to this infection.  Please follow-up with your PCP within the next week for recheck of your symptoms and to ensure this lymph node has gone down in size.  If the lymph node has not gone down in size, you may need further evaluation of this lymph node.  I have included your CT scan results below.  Please make your PCP aware of these findings.  You have been prescribed an antibiotic called Augmentin  to treat your sinus infection. Take this antibiotic 2 times a day for the next 7 days.  You were given your first dose of the antibiotic here today.  You may take your next dose tomorrow morning.  Take the full course of your antibiotic even if you start feeling better. Antibiotics may cause you to have diarrhea.   Your workup today is reassuring. It is very unlikely that your pain is due to an issue in your heart.  Your cardiac enzyme (troponin) was normal today. Your EKG which is a measure of the heart's electrical activity and rhythm is normal today. These would both show abnormalities if you were having a heart attack.  Your chest x-ray is normal today.  You do not have any signs of a blood clot in your legs or in your lungs.  Your blood counts, electrolytes, and kidney function were normal today.  Return to the ER if you have any shortness of breath, difficulty breathing, worsening chest pain, dizziness, jaw pain, left arm or shoulder pain, abdominal pain, unexplained fever, any other new or concerning symptoms.     EXAM: CT NECK WITH CONTRAST   TECHNIQUE: Multidetector CT imaging of the neck was performed using the standard protocol following the bolus administration of intravenous contrast.   RADIATION DOSE REDUCTION: This exam was performed according to the departmental dose-optimization program which includes automated exposure  control, adjustment of the mA and/or kV according to patient size and/or use of iterative reconstruction technique.   CONTRAST:  75mL OMNIPAQUE  IOHEXOL  300 MG/ML  SOLN   COMPARISON:  Cervical spine CT 12/11/2020.   FINDINGS: Pharynx and larynx: No appreciable swelling or mass within the oral cavity, pharynx or larynx. Punctate calcific foci within the bilateral palatine tonsils, which may reflect postinflammatory calcifications and/or tonsilloliths.   Salivary glands: No inflammation, mass, or stone.   Thyroid : Unremarkable.   Lymph nodes: Nonspecific mildly enlarged left level IB lymph node, measuring 12 mm in short axis (series 2, image 50). There are additional lymph nodes within the left neck which are asymmetrically prominent, but not technically enlarged by short axis criteria (measuring subcentimeter).   Vascular: The major vascular structures of the neck are patent. Atherosclerotic plaque about the right carotid bifurcation and within the proximal right ICA.   Limited intracranial: No evidence of an acute intracranial abnormality within the field of view.   Visualized orbits: No orbital mass or acute orbital finding.   Mastoids and visualized paranasal sinuses: Mild mucosal thickening within anterior right ethmoid air cells. No significant mastoid effusion.   Skeleton: No acute fracture or aggressive osseous lesion.   Upper chest: No consolidation within the imaged lung apices.   IMPRESSION: 1. Mildly enlarged left level IB lymph node with prominent (although not technically enlarged) lymph nodes elsewhere within the left neck, nonspecific. Close clinical follow-up is recommended (with imaging follow-up  as warranted). 2. No mass or collection identified within the neck. 3. Minor right ethmoid sinus mucosal thickening. 4. Punctate calcific foci within the bilateral palatine tonsils, which may reflect tonsilloliths and/or postinflammatory calcifications. 5.  Atherosclerotic plaque about the right carotid bifurcation and within the proximal right internal carotid artery. Please note, this examination is not tailored for the assessment of arterial stenoses. Consider a non-emergent carotid artery duplex for further evaluation.     Electronically Signed   By: Rockey Childs D.O.   On: 02/14/2024 16:02

## 2024-02-14 NOTE — ED Triage Notes (Signed)
 Arrives POV with complaints of chest left sided chest pain and shortness of breath x1 day.

## 2024-02-19 ENCOUNTER — Other Ambulatory Visit: Payer: Self-pay | Admitting: Physician Assistant

## 2024-02-19 MED ORDER — ONDANSETRON HCL 4 MG PO TABS: 4.0000 mg | ORAL_TABLET | Freq: Three times a day (TID) | ORAL | 0 refills | Status: DC | PRN

## 2024-02-19 MED ORDER — HYDROCODONE-ACETAMINOPHEN 5-325 MG PO TABS
1.0000 | ORAL_TABLET | Freq: Three times a day (TID) | ORAL | 0 refills | Status: AC | PRN
Start: 2024-02-19 — End: ?

## 2024-02-20 ENCOUNTER — Telehealth: Payer: Self-pay

## 2024-02-20 NOTE — Telephone Encounter (Signed)
 Patient would like to know if she can be worked into the schedule Wednesday, 02/21/24.  Stated that she is having severe pain that is radiating up her arms into her shoulders.  Cb# [phone].  Please advise.  Thank you.

## 2024-02-20 NOTE — Telephone Encounter (Signed)
 Called patient. No openings for Jerri or Morna this week. She does not want to wait. Scheduled with Ronal Dragon at [facility] for Thursday.

## 2024-02-20 NOTE — Telephone Encounter (Signed)
 No, next available please.  Thanks.

## 2024-02-22 ENCOUNTER — Ambulatory Visit (HOSPITAL_BASED_OUTPATIENT_CLINIC_OR_DEPARTMENT_OTHER): Admitting: Physician Assistant

## 2024-02-27 DIAGNOSIS — E118 Type 2 diabetes mellitus with unspecified complications: Secondary | ICD-10-CM | POA: Diagnosis not present

## 2024-02-28 ENCOUNTER — Encounter (HOSPITAL_BASED_OUTPATIENT_CLINIC_OR_DEPARTMENT_OTHER): Payer: Self-pay | Admitting: Orthopaedic Surgery

## 2024-03-01 ENCOUNTER — Telehealth: Payer: Self-pay | Admitting: Orthopaedic Surgery

## 2024-03-01 ENCOUNTER — Ambulatory Visit: Admitting: Orthopaedic Surgery

## 2024-03-01 NOTE — Telephone Encounter (Signed)
 thanks

## 2024-03-01 NOTE — Telephone Encounter (Signed)
 Patient called to cancel bilateral carpal tunnel release at [facility] on 03-06-24.  She states her sugar is not regulated yet.  She will call when ready to reschedule.

## 2024-03-05 DIAGNOSIS — J45909 Unspecified asthma, uncomplicated: Secondary | ICD-10-CM | POA: Diagnosis not present

## 2024-03-05 DIAGNOSIS — F419 Anxiety disorder, unspecified: Secondary | ICD-10-CM | POA: Diagnosis not present

## 2024-03-05 DIAGNOSIS — E118 Type 2 diabetes mellitus with unspecified complications: Secondary | ICD-10-CM | POA: Diagnosis not present

## 2024-03-05 DIAGNOSIS — M797 Fibromyalgia: Secondary | ICD-10-CM | POA: Diagnosis not present

## 2024-03-05 DIAGNOSIS — I10 Essential (primary) hypertension: Secondary | ICD-10-CM | POA: Diagnosis not present

## 2024-03-05 DIAGNOSIS — Z124 Encounter for screening for malignant neoplasm of cervix: Secondary | ICD-10-CM | POA: Diagnosis not present

## 2024-03-05 DIAGNOSIS — E782 Mixed hyperlipidemia: Secondary | ICD-10-CM | POA: Diagnosis not present

## 2024-03-05 DIAGNOSIS — J0141 Acute recurrent pansinusitis: Secondary | ICD-10-CM | POA: Diagnosis not present

## 2024-03-06 ENCOUNTER — Ambulatory Visit (HOSPITAL_BASED_OUTPATIENT_CLINIC_OR_DEPARTMENT_OTHER): Admission: RE | Admit: 2024-03-06 | Source: Home / Self Care | Admitting: Orthopaedic Surgery

## 2024-03-06 ENCOUNTER — Encounter (HOSPITAL_BASED_OUTPATIENT_CLINIC_OR_DEPARTMENT_OTHER): Admission: RE | Payer: Self-pay | Source: Home / Self Care

## 2024-03-06 HISTORY — DX: Type 2 diabetes mellitus without complications: E11.9

## 2024-03-06 HISTORY — DX: Gastro-esophageal reflux disease without esophagitis: K21.9

## 2024-03-06 HISTORY — DX: Allergy, unspecified, initial encounter: T78.40XA

## 2024-03-06 SURGERY — CARPAL TUNNEL RELEASE
Anesthesia: General | Site: Wrist | Laterality: Bilateral

## 2024-03-08 ENCOUNTER — Encounter (INDEPENDENT_AMBULATORY_CARE_PROVIDER_SITE_OTHER): Payer: Self-pay

## 2024-03-15 ENCOUNTER — Encounter: Admitting: Orthopaedic Surgery

## 2024-03-19 ENCOUNTER — Ambulatory Visit: Admitting: Orthopaedic Surgery

## 2024-03-26 ENCOUNTER — Encounter: Payer: Self-pay | Admitting: Physical Medicine and Rehabilitation

## 2024-04-08 ENCOUNTER — Encounter: Admitting: Physical Medicine and Rehabilitation

## 2024-05-10 ENCOUNTER — Other Ambulatory Visit: Payer: Self-pay | Admitting: Family Medicine

## 2024-05-10 DIAGNOSIS — Z1231 Encounter for screening mammogram for malignant neoplasm of breast: Secondary | ICD-10-CM

## 2024-05-16 ENCOUNTER — Institutional Professional Consult (permissible substitution) (INDEPENDENT_AMBULATORY_CARE_PROVIDER_SITE_OTHER): Admitting: Otolaryngology

## 2024-05-16 DIAGNOSIS — N816 Rectocele: Secondary | ICD-10-CM | POA: Diagnosis not present

## 2024-05-16 DIAGNOSIS — R300 Dysuria: Secondary | ICD-10-CM | POA: Diagnosis not present

## 2024-05-16 DIAGNOSIS — Z01419 Encounter for gynecological examination (general) (routine) without abnormal findings: Secondary | ICD-10-CM | POA: Diagnosis not present

## 2024-05-16 DIAGNOSIS — Z113 Encounter for screening for infections with a predominantly sexual mode of transmission: Secondary | ICD-10-CM | POA: Diagnosis not present

## 2024-05-16 DIAGNOSIS — Z780 Asymptomatic menopausal state: Secondary | ICD-10-CM | POA: Diagnosis not present

## 2024-05-16 DIAGNOSIS — N898 Other specified noninflammatory disorders of vagina: Secondary | ICD-10-CM | POA: Diagnosis not present

## 2024-05-17 ENCOUNTER — Encounter (INDEPENDENT_AMBULATORY_CARE_PROVIDER_SITE_OTHER): Payer: Self-pay | Admitting: Otolaryngology

## 2024-05-17 ENCOUNTER — Ambulatory Visit (INDEPENDENT_AMBULATORY_CARE_PROVIDER_SITE_OTHER): Admitting: Otolaryngology

## 2024-05-17 ENCOUNTER — Ambulatory Visit (INDEPENDENT_AMBULATORY_CARE_PROVIDER_SITE_OTHER): Admitting: Audiology

## 2024-05-17 VITALS — BP 117/84 | HR 82 | Temp 98.0°F | Ht <= 58 in | Wt 228.0 lb

## 2024-05-17 DIAGNOSIS — H6982 Other specified disorders of Eustachian tube, left ear: Secondary | ICD-10-CM

## 2024-05-17 DIAGNOSIS — H90A31 Mixed conductive and sensorineural hearing loss, unilateral, right ear with restricted hearing on the contralateral side: Secondary | ICD-10-CM

## 2024-05-17 DIAGNOSIS — H9313 Tinnitus, bilateral: Secondary | ICD-10-CM | POA: Diagnosis not present

## 2024-05-17 DIAGNOSIS — H903 Sensorineural hearing loss, bilateral: Secondary | ICD-10-CM

## 2024-05-17 DIAGNOSIS — H6692 Otitis media, unspecified, left ear: Secondary | ICD-10-CM | POA: Diagnosis not present
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
## 2024-05-17 NOTE — Progress Notes (Signed)
 CC: Bilateral hearing loss, tinnitus, recurrent left ear infection  Discussed the use of AI scribe software for clinical note transcription with the patient, who gave verbal consent to proceed.  History of Present Illness Kathryn Moon is a 53 year old female with recurrent left otitis media who presents with hearing issues and ear pain.  She has a long-standing issue with her left ear, which she attributes to ear damage from exposure to smoke in her childhood. She experiences recurrent ear infections approximately every six months, characterized by inflammation, pain, and drainage. Despite antibiotic treatment, her symptoms persist, and she was recently prescribed prednisone .  She has a history of having a tube placed in her left ear as an adult, but it fell out the next day. Her left ear has been problematic throughout her life, with frequent infections diagnosed as otitis media. Over the past year, she has had about five ear infections, often requiring visits to urgent care.  She reports having bad hearing and ringing in her ears, and she manages the ringing by turning up the volume on her TV to help drown it out. The ringing is bothersome, and she uses external sounds to help drown it out.     Past Medical History:  Diagnosis Date   Acne    on bactrim   Allergies    Anxiety    Asthma    Diabetes mellitus without complication (HCC)    Eustachian tube dysfunction    Fibromyalgia    GERD (gastroesophageal reflux disease)     Past Surgical History:  Procedure Laterality Date   MYRINGOTOMY WITH TUBE PLACEMENT     TUBAL LIGATION      Family History  Problem Relation Age of Onset   Healthy Father    Anesthesia problems Neg Hx     Social History:  reports that she has quit smoking. Her smoking use included cigarettes. She has never used smokeless tobacco. She reports that she does not currently use alcohol. She reports that she does not currently use drugs.  Allergies:   Allergies  Allergen Reactions   Pollen Extract Other (See Comments)    Prior to Admission medications   Medication Sig Start Date End Date Taking? Authorizing Provider  albuterol  (ACCUNEB ) 0.63 MG/3ML nebulizer solution Take 1 ampule by nebulization every 6 (six) hours as needed for wheezing or shortness of breath.   Yes [provider]  ALPRAZolam (XANAX) 0.5 MG tablet Take 0.25-0.5 mg by mouth daily as needed for anxiety or sleep. Takes for anxiety    Yes [provider]  amoxicillin -clavulanate (AUGMENTIN ) 875-125 MG tablet Take 1 tablet by mouth every 12 (twelve) hours. 02/15/24  Yes Veta Palma, PA-C  B Complex Vitamins (VITAMIN B COMPLEX PO) Take 1 tablet by mouth daily.   Yes [provider]  budesonide-formoterol (SYMBICORT) 160-4.5 MCG/ACT inhaler Inhale 2 puffs into the lungs 2 (two) times daily.   Yes [provider]  cholecalciferol (VITAMIN D3) 25 MCG (1000 UNIT) tablet Take 1,000 Units by mouth daily.   Yes [provider]  famotidine  (PEPCID ) 20 MG tablet Take 1 tablet (20 mg total) by mouth 2 (two) times daily. 02/06/20  Yes Henderly, Britni A, PA-C  fluticasone (FLONASE) 50 MCG/ACT nasal spray Place 1 spray into both nostrils daily.    Yes [provider]  HYDROcodone -acetaminophen  (NORCO/VICODIN) 5-325 MG tablet Take 1 tablet by mouth 3 (three) times daily as needed. To be taken after surgery 02/19/24  Yes Jule Ronal CROME, PA-C  hydrOXYzine  (ATARAX /VISTARIL ) 25 MG tablet Take 1 tablet (25 mg total) by mouth every 8 (eight) hours as needed for anxiety. 07/03/20  Yes Lamptey, Aleene KIDD, MD  IBUPROFEN  PO Take by mouth.   Yes [provider]  insulin glargine (LANTUS) 100 UNIT/ML injection Inject 24 Units into the skin 2 (two) times daily. PATIENT TAKES 20U IN AM AND 24U IN PM   Yes [provider]  insulin lispro (HUMALOG) 100 UNIT/ML injection Inject 8 Units into the skin 3 (three) times daily with meals.    Yes [provider]  loratadine (CLARITIN) 10 MG tablet Take 10 mg by mouth daily as needed for allergies.   Yes [provider]  nystatin  (MYCOSTATIN /NYSTOP ) powder Apply 1 application topically 3 (three) times daily. 09/18/21  Yes Chandra Harlene LABOR, NP  ondansetron  (ZOFRAN ) 4 MG tablet Take 1 tablet (4 mg total) by mouth every 8 (eight) hours as needed for nausea or vomiting. 02/06/20  Yes Henderly, Britni A, PA-C  ondansetron  (ZOFRAN ) 4 MG tablet Take 1 tablet (4 mg total) by mouth every 8 (eight) hours as needed. 02/19/24  Yes Jule Ronal CROME, PA-C  Pedialyte (PEDIALYTE) SOLN Take 240 mLs by mouth daily.   Yes [provider]  promethazine  (PHENERGAN ) 25 MG tablet Take 25 mg by mouth every 4 (four) hours as needed for nausea/vomiting. 12/20/18  Yes [provider]  promethazine -dextromethorphan (PROMETHAZINE -DM) 6.25-15 MG/5ML syrup Take 5 mLs by mouth 3 (three) times daily as needed for cough. 10/13/21  Yes Arloa Suzen RAMAN, NP  sucralfate  (CARAFATE ) 1 GM/10ML suspension Take 10 mLs (1 g total) by mouth 4 (four) times daily -  with meals and at bedtime. 02/06/20  Yes Henderly, Britni A, PA-C  prochlorperazine  (COMPAZINE ) 10 MG tablet Take 1 tablet (10 mg total) by mouth 2 (two) times daily as needed (headache). Patient not taking: Reported on 05/07/2019 09/18/18 01/24/20  [name], MD    Blood pressure 117/84, pulse 82, temperature 98 F (36.7 C), temperature source Oral, height 4' 9 (1.448 m), weight 228 lb (103.4 kg), last menstrual period 01/20/2020, SpO2 96%. Exam: General: Communicates without difficulty, well nourished, no acute distress. Head: Normocephalic, no evidence injury, no tenderness, facial buttresses intact without stepoff. Face/sinus: No tenderness to palpation and percussion. Facial movement is normal and symmetric. Eyes: PERRL, EOMI. No scleral icterus, conjunctivae clear. Neuro: CN II exam reveals vision grossly intact.  No nystagmus  at any point of gaze. Ears: Auricles well formed without lesions.  Ear canals are intact without mass or lesion.  No erythema or edema is appreciated.  The right tympanic membrane is intact and mobile.  The left tympanic membrane is retracted.  Nose: External evaluation reveals normal support and skin without lesions.  Dorsum is intact.  Anterior rhinoscopy reveals congested mucosa over anterior aspect of inferior turbinates and intact septum.  No purulence noted. Oral:  Oral cavity and oropharynx are intact, symmetric, without erythema or edema.  Mucosa is moist without lesions. Neck: Full range of motion without pain.  There is no significant lymphadenopathy.  No masses palpable.  Thyroid  bed within normal limits to palpation.  Parotid glands and submandibular glands equal bilaterally without mass.  Trachea is midline. Neuro:  CN 2-12 grossly intact.   Her hearing test shows bilateral high-frequency sensorineural hearing loss.  Assessment and Plan Assessment & Plan Recurrent left otitis media with eardrum retraction and scarring Chronic recurrent infections approximately five times in the past year. Left eardrum is retracted with scarring,  indicating previous infections and left ear eustachian tube dysfunction.  - Schedule tympanostomy tube placement for the left ear.  The risk, benefits, and details of the procedure are discussed with the patient.  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 Hearing loss is symmetric and not related to recurrent ear infections.  Tinnitus associated with hearing loss Tinnitus likely secondary to high frequency sensorineural hearing loss. She uses external sounds to mask the tinnitus, such as turning up the TV volume. - The strategies to cope with tinnitus, including the use of masker, hearing aids, tinnitus retraining therapy, and avoidance of caffeine and  alcohol are discussed.    Kathryn Moon 05/17/2024, 12:26 PM

## 2024-05-18 ENCOUNTER — Ambulatory Visit
Admission: RE | Admit: 2024-05-18 | Discharge: 2024-05-18 | Disposition: A | Discharge status: Home / Self Care | Source: Ambulatory Visit | Attending: Family Medicine | Admitting: Family Medicine

## 2024-05-18 DIAGNOSIS — Z1231 Encounter for screening mammogram for malignant neoplasm of breast: Secondary | ICD-10-CM

## 2024-05-18 DIAGNOSIS — H903 Sensorineural hearing loss, bilateral: Secondary | ICD-10-CM | POA: Insufficient documentation

## 2024-05-18 DIAGNOSIS — H6982 Other specified disorders of Eustachian tube, left ear: Secondary | ICD-10-CM | POA: Insufficient documentation

## 2024-05-18 DIAGNOSIS — H9313 Tinnitus, bilateral: Secondary | ICD-10-CM | POA: Insufficient documentation

## 2024-05-22 ENCOUNTER — Encounter: Payer: Self-pay | Admitting: Audiology

## 2024-05-27 ENCOUNTER — Encounter: Payer: Self-pay | Admitting: Radiology

## 2024-05-31 ENCOUNTER — Telehealth (INDEPENDENT_AMBULATORY_CARE_PROVIDER_SITE_OTHER): Payer: Self-pay | Admitting: Otolaryngology

## 2024-05-31 NOTE — Telephone Encounter (Signed)
 The patient called in needing to know what is the next step for her treatment plan is.  She had her consult with Karis and had her hearing tested.  Please advise.

## 2024-06-04 ENCOUNTER — Encounter: Admitting: Physical Medicine and Rehabilitation

## 2024-06-11 ENCOUNTER — Emergency Department (HOSPITAL_BASED_OUTPATIENT_CLINIC_OR_DEPARTMENT_OTHER)
Admission: EM | Admit: 2024-06-11 | Discharge: 2024-06-11 | Disposition: A | Discharge status: Home / Self Care | Source: Ambulatory Visit | Attending: Emergency Medicine | Admitting: Emergency Medicine

## 2024-06-11 ENCOUNTER — Other Ambulatory Visit: Payer: Self-pay

## 2024-06-11 ENCOUNTER — Emergency Department (HOSPITAL_BASED_OUTPATIENT_CLINIC_OR_DEPARTMENT_OTHER)

## 2024-06-11 DIAGNOSIS — I10 Essential (primary) hypertension: Secondary | ICD-10-CM | POA: Diagnosis not present

## 2024-06-11 DIAGNOSIS — G44209 Tension-type headache, unspecified, not intractable: Secondary | ICD-10-CM

## 2024-06-11 DIAGNOSIS — J45909 Unspecified asthma, uncomplicated: Secondary | ICD-10-CM | POA: Insufficient documentation

## 2024-06-11 DIAGNOSIS — Z794 Long term (current) use of insulin: Secondary | ICD-10-CM | POA: Diagnosis not present

## 2024-06-11 DIAGNOSIS — D72829 Elevated white blood cell count, unspecified: Secondary | ICD-10-CM | POA: Insufficient documentation

## 2024-06-11 DIAGNOSIS — R519 Headache, unspecified: Secondary | ICD-10-CM | POA: Diagnosis present

## 2024-06-11 DIAGNOSIS — Z7951 Long term (current) use of inhaled steroids: Secondary | ICD-10-CM | POA: Diagnosis not present

## 2024-06-11 LAB — URINALYSIS, W/ REFLEX TO CULTURE (INFECTION SUSPECTED)
Bilirubin Urine: NEGATIVE
Glucose, UA: NEGATIVE mg/dL
Hgb urine dipstick: NEGATIVE
Ketones, ur: NEGATIVE mg/dL
Leukocytes,Ua: NEGATIVE
Nitrite: NEGATIVE
Protein, ur: NEGATIVE mg/dL
Specific Gravity, Urine: 1.024 (ref 1.005–1.030)
pH: 5.5 (ref 5.0–8.0)

## 2024-06-11 LAB — CBC WITH DIFFERENTIAL/PLATELET
Abs Immature Granulocytes: 0.05 K/uL (ref 0.00–0.07)
Basophils Absolute: 0 K/uL (ref 0.0–0.1)
Basophils Relative: 0 %
Eosinophils Absolute: 0.2 K/uL (ref 0.0–0.5)
Eosinophils Relative: 1 %
HCT: 41.9 % (ref 36.0–46.0)
Hemoglobin: 14.6 g/dL (ref 12.0–15.0)
Immature Granulocytes: 0 %
Lymphocytes Relative: 36 %
Lymphs Abs: 4.7 K/uL — ABNORMAL HIGH (ref 0.7–4.0)
MCH: 29.1 pg (ref 26.0–34.0)
MCHC: 34.8 g/dL (ref 30.0–36.0)
MCV: 83.6 fL (ref 80.0–100.0)
Monocytes Absolute: 0.7 K/uL (ref 0.1–1.0)
Monocytes Relative: 5 %
Neutro Abs: 7.4 K/uL (ref 1.7–7.7)
Neutrophils Relative %: 58 %
Platelets: 288 K/uL (ref 150–400)
RBC: 5.01 MIL/uL (ref 3.87–5.11)
RDW: 13 % (ref 11.5–15.5)
WBC: 13 K/uL — ABNORMAL HIGH (ref 4.0–10.5)
nRBC: 0 % (ref 0.0–0.2)

## 2024-06-11 LAB — BASIC METABOLIC PANEL WITH GFR
Anion gap: 14 (ref 5–15)
BUN: 11 mg/dL (ref 6–20)
CO2: 25 mmol/L (ref 22–32)
Calcium: 10.3 mg/dL (ref 8.9–10.3)
Chloride: 100 mmol/L (ref 98–111)
Creatinine, Ser: 0.49 mg/dL (ref 0.44–1.00)
GFR, Estimated: >60 mL/min (ref >60)
Glucose, Bld: 136 mg/dL — ABNORMAL HIGH (ref 70–99)
Potassium: 3.9 mmol/L (ref 3.5–5.1)
Sodium: 138 mmol/L (ref 135–145)

## 2024-06-11 LAB — TROPONIN T, HIGH SENSITIVITY: Troponin T High Sensitivity: <15 ng/L (ref 0–19)

## 2024-06-11 NOTE — Discharge Instructions (Addendum)
 Your headache symptoms resolved prior to evaluation in the emergency department and you have a normal neurologic exam.  Your blood pressure was elevated at home but was normalized by the time of your evaluation and you are in the emergency department.  Your EKG, chest x-ray and screening laboratory evaluation was reassuring.  Follow-up outpatient with your primary care provider and with ENT regarding your need for tympanostomy tube placement.  urinalysis was negative for UTI

## 2024-06-11 NOTE — ED Triage Notes (Signed)
 Pt POV reporting headache and HTN, 160/120 at home, not on BP meds, called PCP and advised to be seen in ED.

## 2024-06-11 NOTE — ED Provider Notes (Signed)
 [place] EMERGENCY DEPARTMENT AT [facility] Provider Note   CSN: [id number] Arrival date & time: 06/11/24  1720     Patient presents with: Hypertension and Headache   Kathryn Moon is a 53 y.o. female.  {Add pertinent medical, surgical, social history, OB history to HPI:32947}  Hypertension Associated symptoms include headaches.  Headache    53 year old female with medical history significant for anxiety, asthma, fibromyalgia, eustachian tube dysfunction (follows outpatient with ENT for chronic eustachian tube dysfunction and episodes of otitis media and is scheduled for myringotomy and tympanostomy tube placement in 2 days), diabetes mellitus, GERD presenting to the emergency department with a chief complaint of headache and hypertension.  Prior to Admission medications   Medication Sig Start Date End Date Taking? Authorizing Provider  albuterol  (ACCUNEB ) 0.63 MG/3ML nebulizer solution Take 1 ampule by nebulization every 6 (six) hours as needed for wheezing or shortness of breath.    [provider]  ALPRAZolam (XANAX) 0.5 MG tablet Take 0.25-0.5 mg by mouth daily as needed for anxiety or sleep. Takes for anxiety     [provider]  amoxicillin -clavulanate (AUGMENTIN ) 875-125 MG tablet Take 1 tablet by mouth every 12 (twelve) hours. 02/15/24   Veta Palma, PA-C  B Complex Vitamins (VITAMIN B COMPLEX PO) Take 1 tablet by mouth daily.    [provider]  budesonide-formoterol (SYMBICORT) 160-4.5 MCG/ACT inhaler Inhale 2 puffs into the lungs 2 (two) times daily.    [provider]  cholecalciferol (VITAMIN D3) 25 MCG (1000 UNIT) tablet Take 1,000 Units by mouth daily.    [provider]  famotidine  (PEPCID ) 20 MG tablet Take 1 tablet (20 mg total) by mouth 2 (two) times daily. 02/06/20   Henderly, Britni A, PA-C  fluticasone (FLONASE) 50 MCG/ACT nasal spray Place 1 spray into both nostrils daily.     [provider]  HYDROcodone -acetaminophen  (NORCO/VICODIN) 5-325 MG tablet Take 1 tablet by mouth 3 (three) times daily as needed. To be taken after surgery 02/19/24   Jule Ronal CROME, PA-C  hydrOXYzine  (ATARAX /VISTARIL ) 25 MG tablet Take 1 tablet (25 mg total) by mouth every 8 (eight) hours as needed for anxiety. 07/03/20   Blaise Aleene KIDD, MD  IBUPROFEN  PO Take by mouth.    [provider]  insulin glargine (LANTUS) 100 UNIT/ML injection Inject 24 Units into the skin 2 (two) times daily. PATIENT TAKES 20U IN AM AND 24U IN PM    [provider]  insulin lispro (HUMALOG) 100 UNIT/ML injection Inject 8 Units into the skin 3 (three) times daily with meals.    [provider]  loratadine (CLARITIN) 10 MG tablet Take 10 mg by mouth daily as needed for allergies.    [provider]  nystatin  (MYCOSTATIN /NYSTOP ) powder Apply 1 application topically 3 (three) times daily. 09/18/21   Chandra Harlene DELENA, NP  ondansetron  (ZOFRAN ) 4 MG tablet Take 1 tablet (4 mg total) by mouth every 8 (eight) hours as needed for nausea or vomiting. 02/06/20   Henderly, Britni A, PA-C  ondansetron  (ZOFRAN ) 4 MG tablet Take 1 tablet (4 mg total) by mouth every 8 (eight) hours as needed. 02/19/24   Jule Ronal CROME, PA-C  Pedialyte (PEDIALYTE) SOLN Take 240 mLs by mouth daily.    [provider]  promethazine  (PHENERGAN ) 25 MG tablet Take 25 mg by mouth every 4 (four) hours as needed for nausea/vomiting. 12/20/18   [provider]  promethazine -dextromethorphan (PROMETHAZINE -DM) 6.25-15 MG/5ML syrup Take 5 mLs by mouth  3 (three) times daily as needed for cough. 10/13/21   Arloa Suzen RAMAN, NP  sucralfate  (CARAFATE ) 1 GM/10ML suspension Take 10 mLs (1 g total) by mouth 4 (four) times daily -  with meals and at bedtime. 02/06/20   Henderly, Britni A, PA-C  prochlorperazine  (COMPAZINE ) 10 MG tablet Take 1 tablet (10 mg total) by mouth 2 (two) times daily as needed (headache). Patient not  taking: Reported on 05/07/2019 09/18/18 01/24/20  [name], MD    Allergies: Pollen extract    Review of Systems  Neurological:  Positive for headaches.    Updated Vital Signs BP 137/79 (BP Location: Right Arm)   Pulse 96   Temp 98.2 F (36.8 C) (Oral)   Resp 18   Ht 5' 1 (1.549 m)   Wt 102.5 kg   LMP 01/20/2020 (Exact Date)   SpO2 98%   BMI 42.70 kg/m   Physical Exam  (all labs ordered are listed, but only abnormal results are displayed) Labs Reviewed - No data to display  EKG: None  Radiology: No results found.  {Document cardiac monitor, telemetry assessment procedure when appropriate:32947} Procedures   Medications Ordered in the ED - No data to display    {Click here for ABCD2, HEART and other calculators REFRESH Note before signing:1}                              Medical Decision Making Amount and/or Complexity of Data Reviewed Labs: ordered. Radiology: ordered.   ***  {Document critical care time when appropriate  Document review of labs and clinical decision tools ie CHADS2VASC2, etc  Document your independent review of radiology images and any outside records  Document your discussion with family members, caretakers and with consultants  Document social determinants of health affecting pt's care  Document your decision making why or why not admission, treatments were needed:32947:::1}   Final diagnoses:  None    ED Discharge Orders     None

## 2024-06-13 ENCOUNTER — Ambulatory Visit (INDEPENDENT_AMBULATORY_CARE_PROVIDER_SITE_OTHER): Admitting: Otolaryngology

## 2024-06-13 ENCOUNTER — Encounter (INDEPENDENT_AMBULATORY_CARE_PROVIDER_SITE_OTHER): Payer: Self-pay | Admitting: Otolaryngology

## 2024-06-13 VITALS — HR 90 | Temp 98.0°F | Ht <= 58 in | Wt 225.0 lb

## 2024-06-13 DIAGNOSIS — H6982 Other specified disorders of Eustachian tube, left ear: Secondary | ICD-10-CM

## 2024-06-13 DIAGNOSIS — H6522 Chronic serous otitis media, left ear: Secondary | ICD-10-CM

## 2024-06-13 NOTE — Progress Notes (Signed)
 Patient ID: Kathryn Moon, female   DOB: [birth year], 53 y.o.   MRN: [id number]  Procedure: Left myringotomy and tube placement.   Indication: Eustachian tube dysfunction and middle ear effusion, not responding to medical treatment.    Descriptions: The patient was placed supine on the exam table.  Under the operating microscope, the left ear canal was cleaned of all cerumen.  Phenol was applied to the TM. After adequate local anesthesia was achieved, a standard myringotomy incision was made at the anterior inferior quadrant of the tympanic membrane.  A scant  amount of serous fluid was suctioned from behind the tympanic membrane.  A collar button tube was placed without difficulty.  The patient tolerated the procedure well.    Follow-up care: Dry ear precaution. The patient will return for reevaluation in approximately 3 months.

## 2024-06-18 ENCOUNTER — Other Ambulatory Visit: Payer: Self-pay

## 2024-06-18 ENCOUNTER — Encounter (HOSPITAL_BASED_OUTPATIENT_CLINIC_OR_DEPARTMENT_OTHER): Payer: Self-pay | Admitting: Emergency Medicine

## 2024-06-18 ENCOUNTER — Emergency Department (HOSPITAL_BASED_OUTPATIENT_CLINIC_OR_DEPARTMENT_OTHER)
Admission: EM | Admit: 2024-06-18 | Discharge: 2024-06-18 | Discharge status: Against Medical Advice | Attending: Emergency Medicine | Admitting: Emergency Medicine

## 2024-06-18 DIAGNOSIS — H9202 Otalgia, left ear: Secondary | ICD-10-CM | POA: Insufficient documentation

## 2024-06-18 DIAGNOSIS — Z5321 Procedure and treatment not carried out due to patient leaving prior to being seen by health care provider: Secondary | ICD-10-CM | POA: Insufficient documentation

## 2024-06-18 LAB — RESP PANEL BY RT-PCR (RSV, FLU A&B, COVID)  RVPGX2
Influenza A by PCR: NEGATIVE
Influenza B by PCR: NEGATIVE
Resp Syncytial Virus by PCR: NEGATIVE
SARS Coronavirus 2 by RT PCR: NEGATIVE

## 2024-06-18 NOTE — ED Triage Notes (Signed)
 Reports left ear pain. States had tubes placed in ear on Friday. Denies any other symptoms.

## 2024-06-19 ENCOUNTER — Telehealth (INDEPENDENT_AMBULATORY_CARE_PROVIDER_SITE_OTHER): Payer: Self-pay

## 2024-06-19 NOTE — Telephone Encounter (Signed)
 Patient called leaving a voicemail with concerns when she blew her ear her tube came out of her ear.   I let Dr. Karis know what was going on. Dr. Karis stated that can happen sometimes, asked me to call the patient back to let her know that can happen sometimes and ask how she is feeling, patient stated she is having pain in her ear. I let the patient know that I am sending a message to our scheduling department to get her on the schedule as soon as possible with Dr. Rojean next available appointment. Patient then stated why can't you schedule me I explained that I am not able to schedule but I am messaging someone right now. Patient then stated she did not want to want and asked why I even called her if I can't schedule her, I explained I was calling to let her know that the tube sometimes can come out like Dr. Karis had asked and to see how the patient was feeling. When I explained I would have someone call her today she stated she did not want to be seen by a [facility] group office. The patient then hung up.

## 2024-06-28 ENCOUNTER — Encounter (HOSPITAL_COMMUNITY): Payer: Self-pay

## 2024-06-28 ENCOUNTER — Other Ambulatory Visit: Payer: Self-pay

## 2024-06-28 ENCOUNTER — Emergency Department (HOSPITAL_COMMUNITY)
Admission: EM | Admit: 2024-06-28 | Discharge: 2024-06-28 | Disposition: A | Discharge status: Home / Self Care | Attending: Emergency Medicine | Admitting: Emergency Medicine

## 2024-06-28 DIAGNOSIS — E119 Type 2 diabetes mellitus without complications: Secondary | ICD-10-CM | POA: Insufficient documentation

## 2024-06-28 DIAGNOSIS — Z794 Long term (current) use of insulin: Secondary | ICD-10-CM | POA: Insufficient documentation

## 2024-06-28 DIAGNOSIS — E876 Hypokalemia: Secondary | ICD-10-CM | POA: Insufficient documentation

## 2024-06-28 DIAGNOSIS — J45909 Unspecified asthma, uncomplicated: Secondary | ICD-10-CM | POA: Insufficient documentation

## 2024-06-28 DIAGNOSIS — R197 Diarrhea, unspecified: Secondary | ICD-10-CM | POA: Diagnosis not present

## 2024-06-28 DIAGNOSIS — R112 Nausea with vomiting, unspecified: Secondary | ICD-10-CM | POA: Diagnosis not present

## 2024-06-28 DIAGNOSIS — D72829 Elevated white blood cell count, unspecified: Secondary | ICD-10-CM | POA: Insufficient documentation

## 2024-06-28 LAB — COMPREHENSIVE METABOLIC PANEL WITH GFR
ALT: 14 U/L (ref 0–44)
AST: 17 U/L (ref 15–41)
Albumin: 3.5 g/dL (ref 3.5–5.0)
Alkaline Phosphatase: 62 U/L (ref 38–126)
Anion gap: 15 (ref 5–15)
BUN: 13 mg/dL (ref 6–20)
CO2: 22 mmol/L (ref 22–32)
Calcium: 9.1 mg/dL (ref 8.9–10.3)
Chloride: 100 mmol/L (ref 98–111)
Creatinine, Ser: 0.79 mg/dL (ref 0.44–1.00)
GFR, Estimated: >60 mL/min (ref >60)
Glucose, Bld: 306 mg/dL — ABNORMAL HIGH (ref 70–99)
Potassium: 3.4 mmol/L — ABNORMAL LOW (ref 3.5–5.1)
Sodium: 137 mmol/L (ref 135–145)
Total Bilirubin: 0.8 mg/dL (ref 0.0–1.2)
Total Protein: 6.5 g/dL (ref 6.5–8.1)

## 2024-06-28 LAB — CBC WITH DIFFERENTIAL/PLATELET
Abs Immature Granulocytes: 0.09 K/uL — ABNORMAL HIGH (ref 0.00–0.07)
Basophils Absolute: 0 K/uL (ref 0.0–0.1)
Basophils Relative: 0 %
Eosinophils Absolute: 0.1 K/uL (ref 0.0–0.5)
Eosinophils Relative: 1 %
HCT: 38.2 % (ref 36.0–46.0)
Hemoglobin: 13.1 g/dL (ref 12.0–15.0)
Immature Granulocytes: 1 %
Lymphocytes Relative: 22 %
Lymphs Abs: 2.4 K/uL (ref 0.7–4.0)
MCH: 28.6 pg (ref 26.0–34.0)
MCHC: 34.3 g/dL (ref 30.0–36.0)
MCV: 83.4 fL (ref 80.0–100.0)
Monocytes Absolute: 0.5 K/uL (ref 0.1–1.0)
Monocytes Relative: 4 %
Neutro Abs: 7.8 K/uL — ABNORMAL HIGH (ref 1.7–7.7)
Neutrophils Relative %: 72 %
Platelets: 237 K/uL (ref 150–400)
RBC: 4.58 MIL/uL (ref 3.87–5.11)
RDW: 12.9 % (ref 11.5–15.5)
WBC: 10.9 K/uL — ABNORMAL HIGH (ref 4.0–10.5)
nRBC: 0 % (ref 0.0–0.2)

## 2024-06-28 LAB — URINALYSIS, ROUTINE W REFLEX MICROSCOPIC
Bilirubin Urine: NEGATIVE
Glucose, UA: >=500 mg/dL — AB
Hgb urine dipstick: NEGATIVE
Ketones, ur: 5 mg/dL — AB
Nitrite: NEGATIVE
Protein, ur: NEGATIVE mg/dL
Specific Gravity, Urine: 1.023 (ref 1.005–1.030)
pH: 5 (ref 5.0–8.0)

## 2024-06-28 LAB — LIPASE, BLOOD: Lipase: 32 U/L (ref 11–51)

## 2024-06-28 LAB — HCG, SERUM, QUALITATIVE: Preg, Serum: NEGATIVE

## 2024-06-28 MED ORDER — POTASSIUM CHLORIDE CRYS ER 10 MEQ PO TBCR: 10.0000 meq | EXTENDED_RELEASE_TABLET | Freq: Once | ORAL | 0 refills | Status: AC

## 2024-06-28 MED ORDER — DICYCLOMINE HCL 20 MG PO TABS: 20.0000 mg | ORAL_TABLET | Freq: Two times a day (BID) | ORAL | 0 refills | Status: AC

## 2024-06-28 MED ORDER — POTASSIUM CHLORIDE CRYS ER 20 MEQ PO TBCR
40.0000 meq | EXTENDED_RELEASE_TABLET | Freq: Once | ORAL | Status: AC
  Administered 2024-06-28: 40 meq via ORAL
  Filled 2024-06-28: qty 2

## 2024-06-28 MED ORDER — ONDANSETRON HCL 4 MG PO TABS: 4.0000 mg | ORAL_TABLET | Freq: Four times a day (QID) | ORAL | 0 refills | Status: AC

## 2024-06-28 MED ORDER — ONDANSETRON HCL 4 MG/2ML IJ SOLN
4.0000 mg | Freq: Once | INTRAMUSCULAR | Status: AC
  Administered 2024-06-28: 4 mg via INTRAVENOUS
  Filled 2024-06-28: qty 2

## 2024-06-28 MED ORDER — SODIUM CHLORIDE 0.9 % IV BOLUS
1000.0000 mL | Freq: Once | INTRAVENOUS | Status: AC
  Administered 2024-06-28: 1000 mL via INTRAVENOUS

## 2024-06-28 NOTE — ED Provider Notes (Signed)
 [place] EMERGENCY DEPARTMENT AT [facility] Provider Note   CSN: [id number] Arrival date & time: 06/28/24  9645     Patient presents with: Nausea and Emesis  HPI Kathryn Moon is a 53 y.o. female presenting for nausea vomiting and diarrhea.  She states her symptoms started around 3 AM this morning all of a sudden.  She also reported generalized abdominal pain but no fever.  She states just before I entered the room that she had a large amount of diarrhea which made her feel much better now does not have abdominal pain.  She reports that she ate some old [employer] yesterday evening for dinner.  Also reports to using THC products.  Was on a 3-day course of Bactrim at the end of October but otherwise denies antibiotic use.  Past Medical History:  Diagnosis Date   Acne    on bactrim   Allergies    Anxiety    Asthma    Diabetes mellitus without complication (HCC)    Eustachian tube dysfunction    Fibromyalgia    GERD (gastroesophageal reflux disease)        Emesis      Prior to Admission medications   Medication Sig Start Date End Date Taking? Authorizing Provider  ondansetron  (ZOFRAN ) 4 MG tablet Take 1 tablet (4 mg total) by mouth every 6 (six) hours. 06/28/24  Yes Ithan Touhey K, PA-C  albuterol  (ACCUNEB ) 0.63 MG/3ML nebulizer solution Take 1 ampule by nebulization every 6 (six) hours as needed for wheezing or shortness of breath.    [provider]  ALPRAZolam (XANAX) 0.5 MG tablet Take 0.25-0.5 mg by mouth daily as needed for anxiety or sleep. Takes for anxiety     [provider]  amoxicillin -clavulanate (AUGMENTIN ) 875-125 MG tablet Take 1 tablet by mouth every 12 (twelve) hours. 02/15/24   Veta Palma, PA-C  B Complex Vitamins (VITAMIN B COMPLEX PO) Take 1 tablet by mouth daily.    [provider]  budesonide-formoterol (SYMBICORT) 160-4.5 MCG/ACT inhaler Inhale 2 puffs into the lungs 2 (two) times daily.    [provider]  cholecalciferol (VITAMIN D3) 25 MCG (1000 UNIT) tablet Take 1,000 Units by mouth daily.    [provider]  famotidine  (PEPCID ) 20 MG tablet Take 1 tablet (20 mg total) by mouth 2 (two) times daily. 02/06/20   Henderly, Britni A, PA-C  fluticasone (FLONASE) 50 MCG/ACT nasal spray Place 1 spray into both nostrils daily.     [provider]  HYDROcodone -acetaminophen  (NORCO/VICODIN) 5-325 MG tablet Take 1 tablet by mouth 3 (three) times daily as needed. To be taken after surgery 02/19/24   Jule Ronal CROME, PA-C  hydrOXYzine  (ATARAX /VISTARIL ) 25 MG tablet Take 1 tablet (25 mg total) by mouth every 8 (eight) hours as needed for anxiety. 07/03/20   Blaise Aleene KIDD, MD  IBUPROFEN  PO Take by mouth.    [provider]  insulin glargine (LANTUS) 100 UNIT/ML injection Inject 24 Units into the skin 2 (two) times daily. PATIENT TAKES 20U IN AM AND 24U IN PM    [provider]  insulin lispro (HUMALOG) 100 UNIT/ML injection Inject 8 Units into the skin 3 (three) times daily with meals.    [provider]  loratadine (CLARITIN) 10 MG tablet Take 10 mg by mouth daily as needed for allergies.    [provider]  nystatin  (MYCOSTATIN /NYSTOP ) powder Apply 1 application topically 3 (three) times daily. 09/18/21   Chandra Harlene DELENA,  NP  Pedialyte (PEDIALYTE) SOLN Take 240 mLs by mouth daily.    [provider]  promethazine  (PHENERGAN ) 25 MG tablet Take 25 mg by mouth every 4 (four) hours as needed for nausea/vomiting. 12/20/18   [provider]  promethazine -dextromethorphan (PROMETHAZINE -DM) 6.25-15 MG/5ML syrup Take 5 mLs by mouth 3 (three) times daily as needed for cough. 10/13/21   Arloa Suzen RAMAN, NP  sucralfate  (CARAFATE ) 1 GM/10ML suspension Take 10 mLs (1 g total) by mouth 4 (four) times daily -  with meals and at bedtime. 02/06/20   Henderly, Britni A, PA-C  prochlorperazine  (COMPAZINE ) 10 MG tablet Take 1 tablet (10 mg  total) by mouth 2 (two) times daily as needed (headache). Patient not taking: Reported on 05/07/2019 09/18/18 01/24/20  [name], MD    Allergies: Pollen extract    Review of Systems  Gastrointestinal:  Positive for vomiting.    Updated Vital Signs BP 139/81   Pulse 72   Temp (!) 97.4 F (36.3 C) (Oral)   Resp 18   Ht 5' 1 (1.549 m)   Wt 102.1 kg   LMP 01/20/2020 (Exact Date)   SpO2 100%   BMI 42.51 kg/m   Physical Exam Vitals and nursing note reviewed.  HENT:     Head: Normocephalic and atraumatic.     Mouth/Throat:     Mouth: Mucous membranes are moist.  Eyes:     General:        Right eye: No discharge.        Left eye: No discharge.     Conjunctiva/sclera: Conjunctivae normal.  Cardiovascular:     Rate and Rhythm: Normal rate and regular rhythm.     Pulses: Normal pulses.     Heart sounds: Normal heart sounds.  Pulmonary:     Effort: Pulmonary effort is normal.     Breath sounds: Normal breath sounds.  Abdominal:     General: Abdomen is flat. There is no distension.     Palpations: Abdomen is soft.     Tenderness: There is no abdominal tenderness.  Skin:    General: Skin is warm and dry.  Neurological:     General: No focal deficit present.  Psychiatric:        Mood and Affect: Mood normal.     (all labs ordered are listed, but only abnormal results are displayed) Labs Reviewed  CBC WITH DIFFERENTIAL/PLATELET - Abnormal; Notable for the following components:      Result Value   WBC 10.9 (*)    Neutro Abs 7.8 (*)    Abs Immature Granulocytes 0.09 (*)    All other components within normal limits  COMPREHENSIVE METABOLIC PANEL WITH GFR - Abnormal; Notable for the following components:   Potassium 3.4 (*)    Glucose, Bld 306 (*)    All other components within normal limits  LIPASE, BLOOD  HCG, SERUM, QUALITATIVE  URINALYSIS, ROUTINE W REFLEX MICROSCOPIC    EKG: None  Radiology: No results found.   Procedures   Medications Ordered in  the ED  sodium chloride  0.9 % bolus 1,000 mL (0 mLs Intravenous Stopped 06/28/24 0553)  ondansetron  (ZOFRAN ) injection 4 mg (4 mg Intravenous Given 06/28/24 0445)                                    Medical Decision Making Amount and/or Complexity of Data Reviewed Labs: ordered.  Risk Prescription drug management.  Initial Impression and Ddx 53 year old well-appearing female presenting for abdominal pain.  Exam was unremarkable.  Abdomen is soft, nontender nondistended.  DDx includes viral gastroenteritis, foodborne illness, electrolyte derangement, less likely appendicitis, acute cholecystitis or acute pancreatitis or other intra-abdominal pathology. Patient PMH that increases complexity of ED encounter:  none  Interpretation of Diagnostics - I independent reviewed and interpreted the labs as followed: WBC 10.9, Potassium 3.4, glucose 302, normal bicarb and anion gap  Patient Reassessment and Ultimate Disposition/Management Workup overall reassuring.  Fluid challenge with no issue.  Suspect this is likely foodborne illness versus viral gastroenteritis.  Advise supportive care at home and send Zofran  to pharmacy.  Was discussing discharge and she mentioned itching with urination.  She requested sending off urinalysis.  That study is pending.  Anticipate discharge.  She may need antibiotics if she has a UTI.  Signed out to PA [employer].   Patient management required discussion with the following services or consulting groups:  None  Complexity of Problems Addressed Acute complicated illness or Injury  Additional Data Reviewed and Analyzed Further history obtained from: Past medical history and medications listed in the EMR and Prior ED visit notes  Patient Encounter Risk Assessment Consideration of hospitalization      Final diagnoses:  Nausea vomiting and diarrhea    ED Discharge Orders          Ordered    ondansetron  (ZOFRAN ) 4 MG tablet  Every 6 hours         06/28/24 0617               Lang Norleen POUR, PA-C 06/28/24 9361    Lorette Mayo, MD 06/28/24 [phone]

## 2024-06-28 NOTE — ED Notes (Signed)
 Patient discharged to home with x3 e-scripts,  breathing is even and unlabored. Skin warm,dry, and natural in color. PT educated on follow up. PT ambulated with an even and steady gate. PT to follow up as directed. Denies any further questions at this time.

## 2024-06-28 NOTE — ED Notes (Signed)
 Darlynn Muskrat, son, [phone] update as soon as possible

## 2024-06-28 NOTE — ED Triage Notes (Signed)
 Pt BIB [facility] from home. Pt woke up around 0300 and had a sudden onset of abdominal pain and emesis. Pt had 6 episodes of emesis prior to EMS arrival. With EMS pt vomited 4 times.   EMS [id number] [id number]

## 2024-07-02 DIAGNOSIS — E118 Type 2 diabetes mellitus with unspecified complications: Secondary | ICD-10-CM | POA: Diagnosis not present

## 2024-07-12 DIAGNOSIS — B379 Candidiasis, unspecified: Secondary | ICD-10-CM | POA: Diagnosis not present

## 2024-07-12 DIAGNOSIS — I10 Essential (primary) hypertension: Secondary | ICD-10-CM | POA: Diagnosis not present

## 2024-07-12 DIAGNOSIS — E118 Type 2 diabetes mellitus with unspecified complications: Secondary | ICD-10-CM | POA: Diagnosis not present

## 2024-07-12 DIAGNOSIS — H6993 Unspecified Eustachian tube disorder, bilateral: Secondary | ICD-10-CM | POA: Diagnosis not present

## 2024-07-15 DIAGNOSIS — R519 Headache, unspecified: Secondary | ICD-10-CM | POA: Diagnosis not present

## 2024-07-15 DIAGNOSIS — Z9622 Myringotomy tube(s) status: Secondary | ICD-10-CM | POA: Diagnosis not present

## 2024-07-31 ENCOUNTER — Ambulatory Visit (INDEPENDENT_AMBULATORY_CARE_PROVIDER_SITE_OTHER): Payer: Self-pay | Admitting: General Practice

## 2024-07-31 DIAGNOSIS — Z91199 Patient's noncompliance with other medical treatment and regimen due to unspecified reason: Secondary | ICD-10-CM

## 2024-07-31 NOTE — Progress Notes (Signed)
 Patient no-showed today's appointment.

## 2024-08-13 ENCOUNTER — Encounter: Attending: Physical Medicine and Rehabilitation | Admitting: Physical Medicine and Rehabilitation
